# Patient Record
Sex: Female | Born: 1945 | Race: White | Hispanic: No | Marital: Married | Smoking: Never smoker
Health system: Southern US, Community
[De-identification: ages and names within clinical notes are randomized; demographics above are authoritative.]

## PROBLEM LIST (undated history)

## (undated) DIAGNOSIS — K3184 Gastroparesis: Secondary | ICD-10-CM

## (undated) DIAGNOSIS — D649 Anemia, unspecified: Secondary | ICD-10-CM

## (undated) DIAGNOSIS — E785 Hyperlipidemia, unspecified: Secondary | ICD-10-CM

## (undated) DIAGNOSIS — I10 Essential (primary) hypertension: Secondary | ICD-10-CM

## (undated) DIAGNOSIS — M792 Neuralgia and neuritis, unspecified: Secondary | ICD-10-CM

## (undated) DIAGNOSIS — E119 Type 2 diabetes mellitus without complications: Secondary | ICD-10-CM

## (undated) DIAGNOSIS — E079 Disorder of thyroid, unspecified: Secondary | ICD-10-CM

## (undated) DIAGNOSIS — N189 Chronic kidney disease, unspecified: Secondary | ICD-10-CM

## (undated) DIAGNOSIS — F319 Bipolar disorder, unspecified: Secondary | ICD-10-CM

## (undated) HISTORY — DX: Disorder of thyroid, unspecified: E07.9

## (undated) HISTORY — DX: Anemia, unspecified: D64.9

## (undated) HISTORY — DX: Hyperlipidemia, unspecified: E78.5

## (undated) HISTORY — DX: Type 2 diabetes mellitus without complications: E11.9

## (undated) HISTORY — DX: Bipolar disorder, unspecified: F31.9

## (undated) HISTORY — PX: CERVICAL DISC SURGERY: SHX588

## (undated) HISTORY — DX: Neuralgia and neuritis, unspecified: M79.2

## (undated) HISTORY — PX: CHOLECYSTECTOMY: SHX55

## (undated) HISTORY — DX: Gastroparesis: K31.84

## (undated) HISTORY — DX: Chronic kidney disease, unspecified: N18.9

## (undated) HISTORY — DX: Essential (primary) hypertension: I10

---

## 2003-11-02 ENCOUNTER — Ambulatory Visit: Payer: Self-pay | Admitting: *Deleted

## 2004-01-15 ENCOUNTER — Ambulatory Visit: Payer: Self-pay | Admitting: Psychiatry

## 2004-04-02 ENCOUNTER — Encounter (HOSPITAL_COMMUNITY): Admission: RE | Admit: 2004-04-02 | Discharge: 2004-07-01 | Payer: Self-pay | Admitting: Endocrinology

## 2004-04-17 ENCOUNTER — Ambulatory Visit: Payer: Self-pay | Admitting: Psychiatry

## 2004-07-15 ENCOUNTER — Ambulatory Visit: Payer: Self-pay | Admitting: Psychiatry

## 2004-08-28 ENCOUNTER — Ambulatory Visit (HOSPITAL_COMMUNITY): Admission: RE | Admit: 2004-08-28 | Discharge: 2004-08-28 | Payer: Self-pay | Admitting: Internal Medicine

## 2004-09-09 ENCOUNTER — Ambulatory Visit: Payer: Self-pay | Admitting: Psychiatry

## 2004-12-03 ENCOUNTER — Emergency Department (HOSPITAL_COMMUNITY): Admission: EM | Admit: 2004-12-03 | Discharge: 2004-12-03 | Payer: Self-pay | Admitting: Emergency Medicine

## 2004-12-04 ENCOUNTER — Ambulatory Visit: Payer: Self-pay | Admitting: Psychiatry

## 2005-03-05 ENCOUNTER — Ambulatory Visit: Payer: Self-pay | Admitting: Psychiatry

## 2005-04-02 ENCOUNTER — Ambulatory Visit: Payer: Self-pay | Admitting: Cardiology

## 2005-06-02 ENCOUNTER — Ambulatory Visit (HOSPITAL_COMMUNITY): Payer: Self-pay | Admitting: Psychiatry

## 2005-08-04 ENCOUNTER — Ambulatory Visit (HOSPITAL_COMMUNITY): Payer: Self-pay | Admitting: Psychiatry

## 2005-11-03 ENCOUNTER — Ambulatory Visit (HOSPITAL_COMMUNITY): Payer: Self-pay | Admitting: Psychiatry

## 2006-02-04 ENCOUNTER — Ambulatory Visit (HOSPITAL_COMMUNITY): Payer: Self-pay | Admitting: Psychiatry

## 2006-05-06 ENCOUNTER — Ambulatory Visit (HOSPITAL_COMMUNITY): Payer: Self-pay | Admitting: Psychiatry

## 2006-07-11 IMAGING — CR DG KNEE COMPLETE 4+V*L*
5 series · 5 of 5 positions shown · non-contrast
Comparison: None.

CLINICAL DATA: Chronic left knee pain, no known injury.

LEFT KNEE - 4 VIEW  08/28/2004:

[view not recorded (1 of 5)]
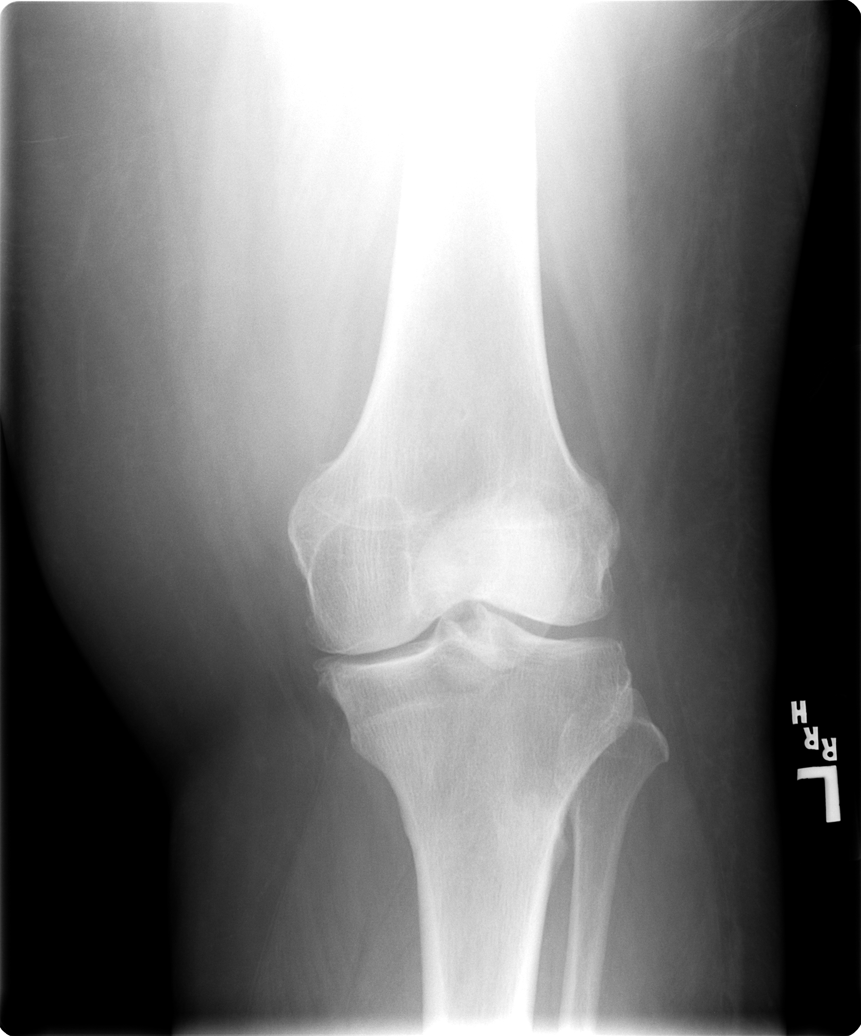

[view not recorded (2 of 5)]
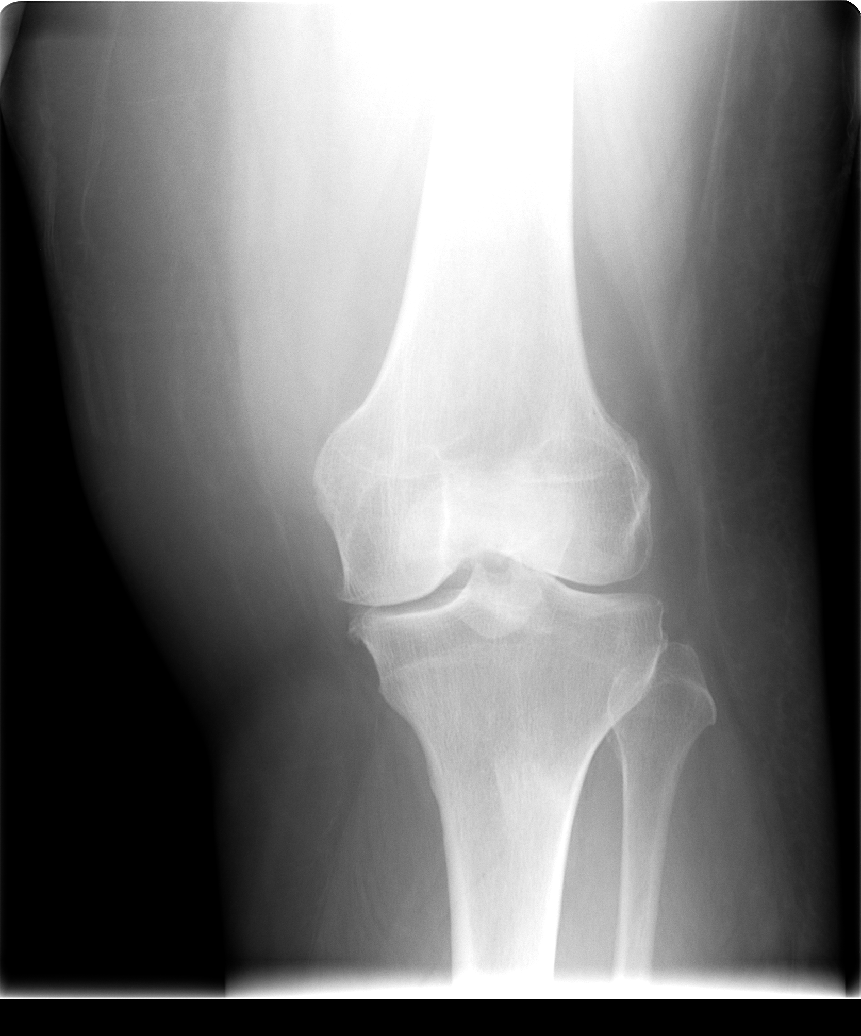

[view not recorded (3 of 5)]
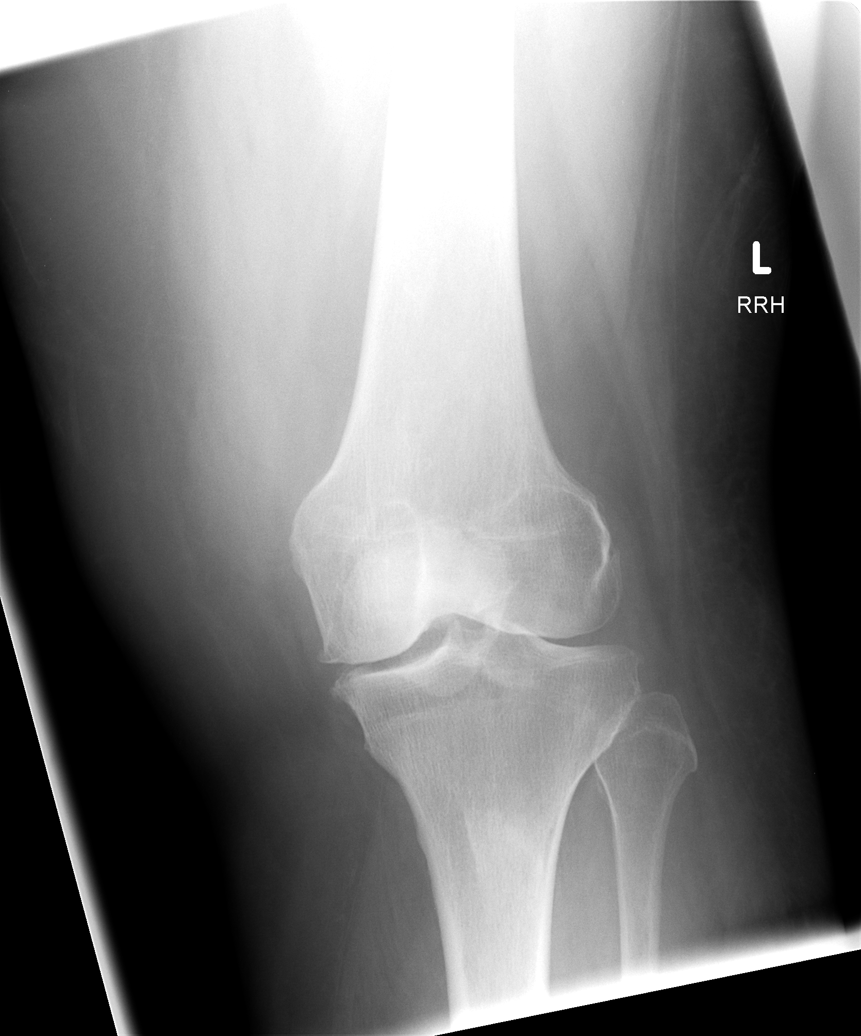

[view not recorded (4 of 5)]
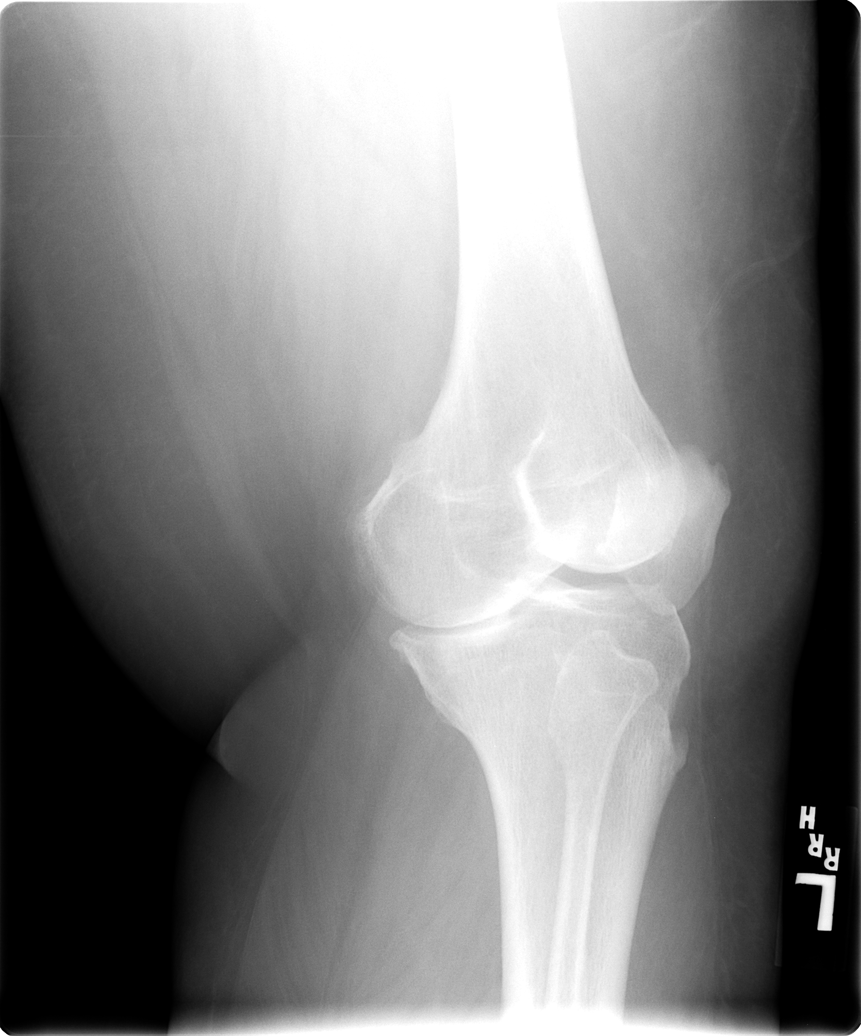

[view not recorded (5 of 5)]
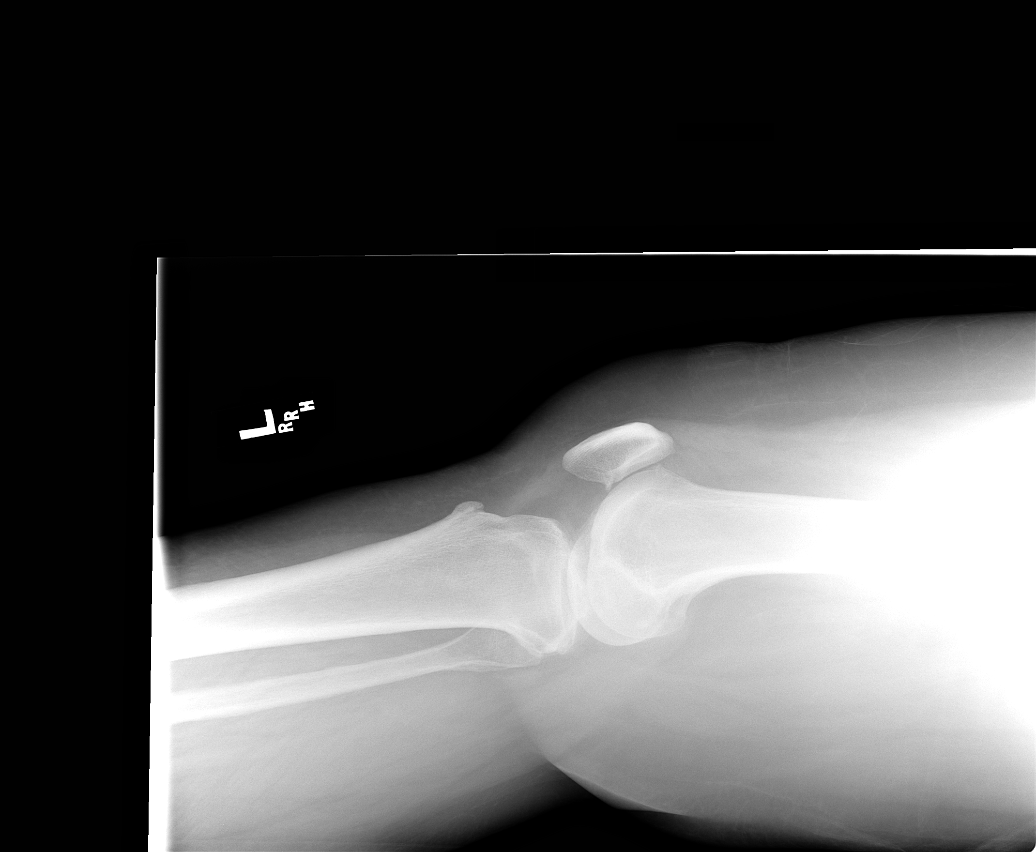

[5 of 5 positions shown; findings below may reference images not displayed]

FINDINGS: There is no evidence of acute or subacute fracture or dislocation.
Marked medial joint space narrowing is present and there is moderate
patellofemoral and medial joint space narrowing. Spurring is present involving
all 3 compartments. Prominent tibial tuberosity is noted. There is no evidence
of a significant joint effusion. The patella is somewhat more inferiorly
positioned than usual.
IMPRESSION: 1. No acute or subacute skeletal abnormality.
2. Moderate osteoarthritis, worst in the medial compartment.
3. Patella baja.

## 2006-08-03 ENCOUNTER — Ambulatory Visit (HOSPITAL_COMMUNITY): Payer: Self-pay | Admitting: Psychiatry

## 2006-11-04 ENCOUNTER — Ambulatory Visit (HOSPITAL_COMMUNITY): Payer: Self-pay | Admitting: Psychiatry

## 2007-02-08 ENCOUNTER — Ambulatory Visit (HOSPITAL_COMMUNITY): Payer: Self-pay | Admitting: Psychiatry

## 2007-03-15 ENCOUNTER — Ambulatory Visit (HOSPITAL_COMMUNITY): Payer: Self-pay | Admitting: Psychiatry

## 2007-05-05 ENCOUNTER — Ambulatory Visit (HOSPITAL_COMMUNITY): Payer: Self-pay | Admitting: Psychiatry

## 2007-08-04 ENCOUNTER — Ambulatory Visit (HOSPITAL_COMMUNITY): Payer: Self-pay | Admitting: Psychiatry

## 2007-12-22 ENCOUNTER — Ambulatory Visit (HOSPITAL_COMMUNITY): Payer: Self-pay | Admitting: Psychiatry

## 2008-04-19 ENCOUNTER — Ambulatory Visit (HOSPITAL_COMMUNITY): Payer: Self-pay | Admitting: Psychiatry

## 2008-08-16 ENCOUNTER — Ambulatory Visit (HOSPITAL_COMMUNITY): Payer: Self-pay | Admitting: Psychiatry

## 2008-12-13 ENCOUNTER — Ambulatory Visit (HOSPITAL_COMMUNITY): Payer: Self-pay | Admitting: Psychiatry

## 2009-04-30 ENCOUNTER — Ambulatory Visit (HOSPITAL_COMMUNITY): Payer: Self-pay | Admitting: Psychiatry

## 2009-07-30 ENCOUNTER — Ambulatory Visit (HOSPITAL_COMMUNITY): Payer: Self-pay | Admitting: Psychiatry

## 2010-01-21 ENCOUNTER — Ambulatory Visit (HOSPITAL_COMMUNITY): Payer: Self-pay | Admitting: Psychiatry

## 2010-04-10 DIAGNOSIS — R079 Chest pain, unspecified: Secondary | ICD-10-CM

## 2010-04-11 DIAGNOSIS — R072 Precordial pain: Secondary | ICD-10-CM

## 2010-04-11 DIAGNOSIS — R0602 Shortness of breath: Secondary | ICD-10-CM

## 2010-05-20 ENCOUNTER — Encounter (INDEPENDENT_AMBULATORY_CARE_PROVIDER_SITE_OTHER): Payer: Medicare Other | Admitting: Psychiatry

## 2010-05-20 DIAGNOSIS — F333 Major depressive disorder, recurrent, severe with psychotic symptoms: Secondary | ICD-10-CM

## 2010-08-19 ENCOUNTER — Encounter (HOSPITAL_COMMUNITY): Payer: Medicare Other | Admitting: Psychiatry

## 2010-08-28 ENCOUNTER — Encounter (INDEPENDENT_AMBULATORY_CARE_PROVIDER_SITE_OTHER): Payer: Medicare Other | Admitting: Psychiatry

## 2010-08-28 ENCOUNTER — Encounter (HOSPITAL_COMMUNITY): Payer: Medicare Other | Admitting: Psychiatry

## 2010-08-28 DIAGNOSIS — F333 Major depressive disorder, recurrent, severe with psychotic symptoms: Secondary | ICD-10-CM

## 2010-10-23 ENCOUNTER — Encounter (INDEPENDENT_AMBULATORY_CARE_PROVIDER_SITE_OTHER): Payer: Medicare Other | Admitting: Psychiatry

## 2010-10-23 DIAGNOSIS — F333 Major depressive disorder, recurrent, severe with psychotic symptoms: Secondary | ICD-10-CM

## 2011-01-03 ENCOUNTER — Other Ambulatory Visit (HOSPITAL_COMMUNITY): Payer: Self-pay | Admitting: Psychiatry

## 2011-01-15 ENCOUNTER — Encounter (HOSPITAL_COMMUNITY): Payer: Medicare Other | Admitting: Psychiatry

## 2011-01-15 ENCOUNTER — Ambulatory Visit (INDEPENDENT_AMBULATORY_CARE_PROVIDER_SITE_OTHER): Payer: Medicare Other | Admitting: Psychiatry

## 2011-01-15 DIAGNOSIS — F3189 Other bipolar disorder: Secondary | ICD-10-CM

## 2011-01-15 MED ORDER — LITHIUM CARBONATE 300 MG PO TABS: ORAL_TABLET | ORAL | Status: DC

## 2011-01-15 MED ORDER — DOXEPIN HCL 50 MG PO CAPS: ORAL_CAPSULE | ORAL | Status: DC

## 2011-01-15 NOTE — Progress Notes (Signed)
Cassandra Vanwinkle [id number]  Subjective Patient came with her husband. sHe is doing better on her current medication. sHe is sleeping good.s He denies any side effects of medication. She is compliant with her medication. She had a good Thanksgiving. Her son got married in September and she is hoping to have a good Christmas. She denies any agitation anger or mood swings. She described her mood has been stable with the medication. She still takes lithium small dose 300 mg half twice a day. Recently she has seen primary care physician and her lithium level is 0.9. She also takes Depakote.  Mental Status Examination Patient is casually dressed and fairly groomed. She uses wheelchair. Her speech is soft clear. There is a poverty of thought content but she denies any active or passive suicidal thoughts or homicidal thoughts. Her thought process is slow but logical linear and goal-directed. She denies any auditory or visual hallucination. Her attention and concentration is fair. She's alert and oriented x3. Her insight judgment and impulse control is okay  Current Medication Current outpatient prescriptions:divalproex (DEPAKOTE) 250 MG DR tablet, , Disp: , Rfl: ;  doxepin (SINEQUAN) 50 MG capsule, Take 5 capsule at bed time, Disp: 150 capsule, Rfl: 2;  SYNTHROID 112 MCG tablet, , Disp: , Rfl: ;  Vitamin D, Ergocalciferol, (DRISDOL) 50000 UNITS CAPS, , Disp: , Rfl: ;  VYTORIN 10-40 MG per tablet, , Disp: , Rfl: ;  lithium 300 MG tablet, Take 1/2 twice a day, Disp: 90 tablet, Rfl: 2   Assessment Bipolar disorder  Plan I will continue her current medication which is doxepin 50 mg and she takes 5 capsule at bedtime and lithium 300 mg half twice a day. I have explained risks and benefits of medication in detail. I recommended to call us if she has any question or concern about the medication or if she feels worsening of her symptoms. I will see her again in 3 months

## 2011-04-16 ENCOUNTER — Encounter (HOSPITAL_COMMUNITY): Payer: Self-pay | Admitting: Psychiatry

## 2011-04-16 ENCOUNTER — Ambulatory Visit (INDEPENDENT_AMBULATORY_CARE_PROVIDER_SITE_OTHER): Payer: Medicare Other | Admitting: Psychiatry

## 2011-04-16 DIAGNOSIS — F319 Bipolar disorder, unspecified: Secondary | ICD-10-CM

## 2011-04-16 DIAGNOSIS — F3189 Other bipolar disorder: Secondary | ICD-10-CM

## 2011-04-16 MED ORDER — DOXEPIN HCL 50 MG PO CAPS: ORAL_CAPSULE | ORAL | Status: DC

## 2011-04-16 MED ORDER — LITHIUM CARBONATE 300 MG PO TABS: ORAL_TABLET | ORAL | Status: DC

## 2011-04-16 NOTE — Progress Notes (Signed)
Cassandra Smitherman [id number]  Chief complaint Patient came today for her followup appointment with her husband. She has been compliant with her medication and reported no side effects. She has recently seen her endocrinologist for management of diabetes. She reported her blood work is stable. Husband also reported she is doing better and taking her medication regularly. Patient denies any agitation anger or mood swings. Patient denies any insomnia or crying spells. She likes her current psychiatric medication. She denies any concern or issues with her medication. She still takes doxepin and lithium for her bipolar disorder.  Current psychiatric medication Doxepin 50 mg 5 at bedtime Lithium 300 mg half twice a day  Depakote 250 mg twice a day prescribed by her primary care physician.  Medical history Patient has history of hyperlipidemia, hypertension, chronic renal insufficiency, gastroparesis, vitamin D deficiency and neuropathic pain. She uses wheelchair for ambulation.  Mental status examination Patient is casually dressed and fairly groomed. She uses wheelchair. Her speech is soft clear. She described her mood is neutral and her affect is mood congruent. There is a poverty of thought content but she denies any active or passive suicidal thoughts or homicidal thoughts. Her thought process is slow but logical linear and goal-directed. She denies any auditory or visual hallucination. Her attention and concentration is fair. She's alert and oriented x3. Her insight judgment and impulse control is okay  Assessment Axis I Bipolar disorder Axis II deferred Axis III see medical history Axis IV mild to moderate Axis V 50-55  Plan I will continue her current medication which is doxepin 50 mg and she takes 5 capsule at bedtime and lithium 300 mg half twice a day. Patient and her husband is away about the insufficiency and impact of lithium on her kidney however they are very reluctant to cut  down lithium does. I have explained risks and benefits of medication in detail. I recommended to call us if she has any question or concern about the medication or if she feels worsening of her symptoms. I will see her again in 3 months

## 2011-07-16 ENCOUNTER — Encounter (HOSPITAL_COMMUNITY): Payer: Self-pay | Admitting: Psychiatry

## 2011-07-16 ENCOUNTER — Ambulatory Visit (INDEPENDENT_AMBULATORY_CARE_PROVIDER_SITE_OTHER): Payer: Medicare Other | Admitting: Psychiatry

## 2011-07-16 DIAGNOSIS — F3189 Other bipolar disorder: Secondary | ICD-10-CM

## 2011-07-16 MED ORDER — LITHIUM CARBONATE 300 MG PO TABS: ORAL_TABLET | ORAL | Status: DC

## 2011-07-16 MED ORDER — DOXEPIN HCL 50 MG PO CAPS: ORAL_CAPSULE | ORAL | Status: DC

## 2011-07-16 NOTE — Progress Notes (Signed)
Chief complaint Medication management and followup.  History of presenting illness. Patient came today for her followup appointment with her husband. She has been compliant with her medication and reported no side effects. She has recently seen her endocrinologist for management of diabetes. She reported her blood work is stable.  She was told her hemoglobin A1c is 5.4.  Patient is very happy about her blood sugar.  She sleeping better.  She denies any agitation anger and mood swing.  She has some tremors however they are chronic.  Her husband endorse that patient is doing better on her current medication.  She does not want to change or reduce the dosage.  She denies any crying spell or any social isolation.  She has no issues with the medication.  Current psychiatric medication Doxepin 50 mg 5 at bedtime Lithium 300 mg half twice a day  Depakote 250 mg twice a day prescribed by her primary care physician.  Medical history Patient has history of hyperlipidemia, hypertension, chronic renal insufficiency, gastroparesis, vitamin D deficiency and neuropathic pain. She uses wheelchair for ambulation.  Her primary care physician is Dr. Clelia Croft in [place].  Mental status examination Patient is casually dressed and fairly groomed.  She is obese and uses wheelchair.  She has multiple scratch marks and bruises and her arm which are chronic.  She has 30 of thought content however her speech is slow and coherent.  Her thought process is also slow but logical linear goal-directed.  Her attention and concentration is fair.  She denies any active or passive suicidal thinking and homicidal thinking.  She described her mood is neutral and her affect is mood congruent.  She denies any auditory or visual hallucination.  There no psychotic symptoms present at this time.  Her attention and concentration is fair. She's alert and oriented x3. Her insight judgment and impulse control is okay  Assessment Axis I Bipolar  disorder Axis II deferred Axis III see medical history Axis IV mild to moderate Axis V 50-55  Plan I will continue her current medication which is doxepin 50 mg and she takes 5 capsule at bedtime and lithium 300 mg half twice a day. Patient and her husband is aware about chronic renal insufficiency and impact of lithium on her kidney however they are very reluctant to cut down lithium does. I have explained risks and benefits of medication in detail. I recommended to call us if she has any question or concern about the medication or if she feels worsening of her symptoms. I will see her again in 3 months

## 2011-10-15 ENCOUNTER — Ambulatory Visit (HOSPITAL_COMMUNITY): Payer: Self-pay | Admitting: Psychiatry

## 2011-10-20 ENCOUNTER — Ambulatory Visit (INDEPENDENT_AMBULATORY_CARE_PROVIDER_SITE_OTHER): Payer: Medicare Other | Admitting: Psychiatry

## 2011-10-20 ENCOUNTER — Encounter (HOSPITAL_COMMUNITY): Payer: Self-pay | Admitting: Psychiatry

## 2011-10-20 DIAGNOSIS — F319 Bipolar disorder, unspecified: Secondary | ICD-10-CM

## 2011-10-20 DIAGNOSIS — F3189 Other bipolar disorder: Secondary | ICD-10-CM

## 2011-10-20 MED ORDER — LITHIUM CARBONATE 300 MG PO TABS: ORAL_TABLET | ORAL | Status: DC

## 2011-10-20 MED ORDER — DOXEPIN HCL 50 MG PO CAPS: ORAL_CAPSULE | ORAL | Status: DC

## 2011-10-20 NOTE — Progress Notes (Signed)
Chief complaint Medication management and followup.  History of presenting illness. Patient came today for her followup appointment with her husband. She has been compliant with her medication and reported no side effects. She denies any insomnia or any mood swing.  She denies any recent fall .  She scheduled to see her endocrinologist in [place] however husband is not happy due to transportation.  Transportation company charged every time he goes to [place] and he is wondering if he can find endocrinologist in this area.  Patient is scheduled to have blood work in this week.  Overall patient is doing well on her current psychiatric medication.  Patient denies any crying spells racing thoughts or any feeling of hopelessness or helplessness.  She likes her current medication and wants to take as prescribed.  She's not drinking or using any illegal substance.  Current psychiatric medication Doxepin 50 mg 5 at bedtime Lithium 300 mg half twice a day  Depakote 250 mg twice a day prescribed by her primary care physician.  Medical history Patient has history of hyperlipidemia, hypertension, chronic renal insufficiency, gastroparesis, vitamin D deficiency and neuropathic pain. She uses wheelchair for ambulation.  Her primary care physician is Dr. Sherryll Burger in [place].  Mental status examination Patient is casually dressed and fairly groomed.  She is obese and uses wheelchair.  She has multiple scratch marks and bruises and her arm which are chronic.  Her speech is slow but clear and coherent.  She has poverty of of thought content however there is no paranoia or delusion or psychotic symptoms.  Her attention and concentration is fair.  She denies any active or passive suicidal thinking and homicidal thinking.  She described her mood is neutral and her affect is mood congruent.  She denies any auditory or visual hallucination.  There no psychotic symptoms present at this time.  Her attention and concentration  is fair. She's alert and oriented x3. Her insight judgment and impulse control is okay  Assessment Axis I Bipolar disorder Axis II deferred Axis III see medical history Axis IV mild to moderate Axis V 50-55  Plan I will continue her current medication which is doxepin 50 mg and she takes 5 capsule at bedtime and lithium 300 mg half twice a day.  Patient has chronic renal insufficiency, patient and her husband is aware about the impact of lithium on her kidney however they are very reluctant to cut down lithium does. I have explained risks and benefits of medication in detail. I recommended to call us if she has any question or concern about the medication or if she feels worsening of her symptoms. I will see her again in 3 months

## 2011-12-28 ENCOUNTER — Telehealth (HOSPITAL_COMMUNITY): Payer: Self-pay | Admitting: *Deleted

## 2011-12-28 ENCOUNTER — Other Ambulatory Visit (HOSPITAL_COMMUNITY): Payer: Self-pay | Admitting: Psychiatry

## 2012-01-12 ENCOUNTER — Encounter (HOSPITAL_COMMUNITY): Payer: Self-pay | Admitting: Psychiatry

## 2012-01-12 ENCOUNTER — Ambulatory Visit (INDEPENDENT_AMBULATORY_CARE_PROVIDER_SITE_OTHER): Payer: Medicare Other | Admitting: Psychiatry

## 2012-01-12 DIAGNOSIS — F319 Bipolar disorder, unspecified: Secondary | ICD-10-CM

## 2012-01-12 DIAGNOSIS — F3189 Other bipolar disorder: Secondary | ICD-10-CM

## 2012-01-12 MED ORDER — LITHIUM CARBONATE 300 MG PO TABS: ORAL_TABLET | ORAL | Status: DC

## 2012-01-12 MED ORDER — DOXEPIN HCL 50 MG PO CAPS: ORAL_CAPSULE | ORAL | Status: DC

## 2012-01-12 NOTE — Progress Notes (Signed)
Chief complaint Chief Complaint  Patient presents with  . Follow-up  . Establish Care  . Medication Refill   Subjective: "I'm doing good.  It is Christmas time and I'm ready for Christmas."  History of presenting illness. Patient came today for her followup appointment with her husband. Pt reports compliance with psychotropic medications with adequate benefit and no side effects.  Her recent lithium level was on the low end, but clinically she is fine. Reportedly she will be getting another one soon.  Will look for those results. Her pain management is also going well.  Her falls have increased recently and she has to use a Kashari Chalmers indoors.  She has noted a little bit of weight increase.  She does a movement video about once a day.  Current psychiatric medication Doxepin 50 mg 5 at bedtime Lithium 300 mg half twice a day  Depakote 250 mg twice a day prescribed by her primary care physician.  Medical history Patient has history of hyperlipidemia, hypertension, chronic renal insufficiency, gastroparesis, vitamin D deficiency and neuropathic pain. She uses wheelchair for ambulation.  Her primary care physician is Dr. Sherryll Burger in [place].  Mental status examination Patient is casually dressed and fairly groomed.  She is obese and uses wheelchair.  She has multiple scratch marks and bruises and her arm which are chronic.  Her speech is slow but clear and coherent.  She has poverty of of thought content however there is no paranoia or delusion or psychotic symptoms.  Her attention and concentration is fair.  She denies any active or passive suicidal thinking and homicidal thinking.  She described her mood is neutral and her affect is mood congruent.  She denies any auditory or visual hallucination.  There no psychotic symptoms present at this time.  Her attention and concentration is fair. She's alert and oriented x3. Her insight judgment and impulse control is okay  Assessment Axis I Bipolar  disorder Axis II deferred Axis III see medical history Axis IV mild to moderate Axis V 50-55  Plan I took her vitals.  I reviewed CC, tobacco/med/surg Hx, meds effects/ side effects, problem list, therapies and responses as well as current situation/symptoms discussed options. See orders and pt instructions for more details.

## 2012-01-12 NOTE — Patient Instructions (Signed)
Have a happy holiday.  

## 2012-02-04 ENCOUNTER — Telehealth (HOSPITAL_COMMUNITY): Payer: Self-pay | Admitting: Psychiatry

## 2012-02-04 DIAGNOSIS — F3189 Other bipolar disorder: Secondary | ICD-10-CM

## 2012-02-05 MED ORDER — LITHIUM CARBONATE 300 MG PO TABS: ORAL_TABLET | ORAL | Status: DC

## 2012-02-05 NOTE — Telephone Encounter (Signed)
[facility] selected as pharmacy and Lithium script sent there.

## 2012-04-11 ENCOUNTER — Encounter (HOSPITAL_COMMUNITY): Payer: Self-pay | Admitting: Psychiatry

## 2012-04-11 ENCOUNTER — Ambulatory Visit (INDEPENDENT_AMBULATORY_CARE_PROVIDER_SITE_OTHER): Payer: 59 | Admitting: Psychiatry

## 2012-04-11 DIAGNOSIS — F3189 Other bipolar disorder: Secondary | ICD-10-CM

## 2012-04-11 DIAGNOSIS — F319 Bipolar disorder, unspecified: Secondary | ICD-10-CM

## 2012-04-11 DIAGNOSIS — F5105 Insomnia due to other mental disorder: Secondary | ICD-10-CM

## 2012-04-11 MED ORDER — LITHIUM CARBONATE 300 MG PO TABS: ORAL_TABLET | ORAL | Status: DC

## 2012-04-11 MED ORDER — DOXEPIN HCL 50 MG PO CAPS: ORAL_CAPSULE | ORAL | Status: DC

## 2012-04-11 NOTE — Progress Notes (Signed)
[facility] [id number] Progress Note DEA BITTING MRN: [id number] DOB: 03-Oct-1945 Age: 67 y.o.  Date: 04/11/2012 Start Time: 10:15 AM End Time: 10:30 AM  Chief Complaint: Chief Complaint  Patient presents with  . Depression  . Follow-up  . Medication Refill   Subjective: "I'm doing good.  It is Christmas time and I'm ready for Christmas." Depression 0/10 and Anxiety 0/10, where 1 is the best and 10 is the worst.  Pain is 4/10 in her leg  History of presenting illness. Patient came today for her followup appointment with her husband.  Pt reports that she is compliant with the psychotropic medications with good benefit and no noticeable side effects.  She seems tio be pleased with how she is doing now.   Current psychiatric medication Doxepin 50 mg 5 at bedtime Lithium 300 mg half twice a day  Depakote 250 mg twice a day prescribed by her primary care physician.  Family History family history is negative for Bipolar disorder, and Dementia, and OCD, and ADD / ADHD, and Alcohol abuse, and Drug abuse, and Anxiety disorder, and Depression, and Paranoid behavior, and Schizophrenia, and Seizures, and Sexual abuse, and Physical abuse, .  Medical history Patient has history of hyperlipidemia, hypertension, chronic renal insufficiency, gastroparesis, vitamin D deficiency and neuropathic pain. She uses wheelchair for ambulation.  Her primary care physician is Dr. Sherryll Burger in [place].  Mental status examination Patient is casually dressed and fairly groomed.  She is obese and uses wheelchair.  She has multiple scratch marks and bruises and her arm which are chronic.  Her speech is slow but clear and coherent.  She has poverty of of thought content however there is no paranoia or delusion or psychotic symptoms.  Her attention and concentration is fair.  She denies any active or passive suicidal thinking and homicidal thinking.  She described her mood is neutral and her affect is mood congruent.   She denies any auditory or visual hallucination.  There no psychotic symptoms present at this time.  Her attention and concentration is fair. She's alert and oriented x3. Her insight judgment and impulse control is okay  Lab Results: No results found for this or any previous visit (from the past 8736 hour(s)). PCP draws her labs with no concerns noted.  Will have the PCP draw the Lithium level.  Assessment Axis I Bipolar disorder Axis II deferred Axis III see medical history Axis IV mild to moderate Axis V 50-55  Plan/Discussion: I took her vitals.  I reviewed CC, tobacco/med/surg Hx, meds effects/ side effects, problem list, therapies and responses as well as current situation/symptoms discussed options. Continue current effective medications. See orders and pt instructions for more details.  Medical Decision Making Problem Points:  Established problem, stable/improving (1), Review of last therapy session (1) and Review of psycho-social stressors (1) Data Points:  Review or order clinical lab tests (1) Review of medication regiment & side effects (2)  I certify that outpatient services furnished can reasonably be expected to improve the patient's condition.   Orson Aloe, MD, [facility]

## 2012-04-11 NOTE — Patient Instructions (Signed)
Call if problems or concerns.  Look forward to hearing about the lithium and Depakote levels at the next visit.

## 2012-07-12 ENCOUNTER — Ambulatory Visit (HOSPITAL_COMMUNITY): Payer: Self-pay | Admitting: Psychiatry

## 2012-07-22 ENCOUNTER — Encounter (HOSPITAL_COMMUNITY): Payer: Self-pay | Admitting: Psychiatry

## 2012-07-22 ENCOUNTER — Ambulatory Visit (INDEPENDENT_AMBULATORY_CARE_PROVIDER_SITE_OTHER): Payer: 59 | Admitting: Psychiatry

## 2012-07-22 VITALS — BP 130/73 | HR 81

## 2012-07-22 DIAGNOSIS — F3189 Other bipolar disorder: Secondary | ICD-10-CM

## 2012-07-22 DIAGNOSIS — F5105 Insomnia due to other mental disorder: Secondary | ICD-10-CM

## 2012-07-22 DIAGNOSIS — F319 Bipolar disorder, unspecified: Secondary | ICD-10-CM

## 2012-07-22 MED ORDER — LITHIUM CARBONATE 300 MG PO TABS: ORAL_TABLET | ORAL | Status: DC

## 2012-07-22 MED ORDER — DOXEPIN HCL 50 MG PO CAPS: ORAL_CAPSULE | ORAL | Status: DC

## 2012-07-22 NOTE — Progress Notes (Signed)
[facility] [address] Progress Note KETTY BITTON MRN: [id number] DOB: September 19, 1945 Age: 67 y.o.  Date: 07/22/2012 Start Time: 10:55 AM End Time: 11:05 AM  Chief Complaint: Chief Complaint  Patient presents with  . Depression  . Follow-up  . Medication Refill   Subjective: "I'm doing good." Depression 0/10 and Anxiety 0/10, where 1 is the best and 10 is the worst.  Pain is 4/10 in her leg  History of presenting illness. Patient came today for her followup appointment with her husband.  Pt reports that she is compliant with the psychotropic medications with good benefit and no noticeable side effects.  She seems tio be pleased with how she is doing now.   Current psychiatric medication Doxepin 50 mg 5 at bedtime Lithium 300 mg half twice a day  Depakote 250 mg twice a day prescribed by her primary care physician.  Vitals: BP 130/73  Pulse 81  Allergies: Allergies  Allergen Reactions  . Tetanus Toxoids Anaphylaxis  . Codeine Nausea And Vomiting   Medical History: Past Medical History  Diagnosis Date  . Diabetes mellitus type II   . Hyperlipemia   . HTN (hypertension)   . CRI (chronic renal insufficiency)   . Neuropathic pain   . Thyroid disease   . Vitamin D deficiency   . Gastroparesis   . Anemia   . Bipolar 1 disorder   Patient has history of hyperlipidemia, hypertension, chronic renal insufficiency, gastroparesis, vitamin D deficiency and neuropathic pain. She uses wheelchair for ambulation.  Her primary care physician is Dr. Sherryll Burger in [place].  Surgical History: Past Surgical History  Procedure Laterality Date  . Cholecystectomy    . Cervical disc surgery     Family History: family history is negative for Bipolar disorder, and Dementia, and OCD, and ADD / ADHD, and Alcohol abuse, and Drug abuse, and Anxiety disorder, and Depression, and Paranoid behavior, and Schizophrenia, and Seizures, and Sexual abuse, and Physical abuse, . Reviewed and nothing new  today.   Mental status examination Patient is casually dressed and fairly groomed.  She is obese and uses wheelchair.  She has multiple scratch marks and bruises and her arm which are chronic.  Her speech is slow but clear and coherent.  She has poverty of of thought content however there is no paranoia or delusion or psychotic symptoms.  Her attention and concentration is fair.  She denies any active or passive suicidal thinking and homicidal thinking.  She described her mood is neutral and her affect is mood congruent.  She denies any auditory or visual hallucination.  There no psychotic symptoms present at this time.  Her attention and concentration is fair. She's alert and oriented x3. Her insight judgment and impulse control is okay  Lab Results: No results found for this or any previous visit (from the past 8736 hour(s)). PCP draws her labs with no concerns noted.  Will have the PCP draw the Lithium level.  Assessment Axis I Bipolar disorder Axis II deferred Axis III see medical history Axis IV mild to moderate Axis V 50-55  Plan/Discussion: I took her vitals.  I reviewed CC, tobacco/med/surg Hx, meds effects/ side effects, problem list, therapies and responses as well as current situation/symptoms discussed options. Continue current effective medications. See orders and pt instructions for more details.  Medical Decision Making Problem Points:  Established problem, stable/improving (1), Review of last therapy session (1) and Review of psycho-social stressors (1) Data Points:  Review or order clinical lab tests (1)  Review of medication regiment & side effects (2)  I certify that outpatient services furnished can reasonably be expected to improve the patient's condition.   Orson Aloe, MD, [facility]

## 2012-07-22 NOTE — Patient Instructions (Signed)
Keep it up.  Call if problems or concerns.  

## 2012-10-21 ENCOUNTER — Encounter (HOSPITAL_COMMUNITY): Payer: Self-pay | Admitting: Psychiatry

## 2012-10-21 ENCOUNTER — Ambulatory Visit (HOSPITAL_COMMUNITY): Payer: Self-pay | Admitting: Psychiatry

## 2012-10-21 ENCOUNTER — Ambulatory Visit (INDEPENDENT_AMBULATORY_CARE_PROVIDER_SITE_OTHER): Payer: 59 | Admitting: Psychiatry

## 2012-10-21 VITALS — BP 120/62 | Ht 60.0 in | Wt 210.0 lb

## 2012-10-21 DIAGNOSIS — F319 Bipolar disorder, unspecified: Secondary | ICD-10-CM

## 2012-10-21 DIAGNOSIS — F3189 Other bipolar disorder: Secondary | ICD-10-CM

## 2012-10-21 MED ORDER — DOXEPIN HCL 50 MG PO CAPS: ORAL_CAPSULE | ORAL | Status: DC

## 2012-10-21 MED ORDER — LITHIUM CARBONATE 300 MG PO TABS: ORAL_TABLET | ORAL | Status: DC

## 2012-10-21 NOTE — Progress Notes (Signed)
Patient ID: Cassandra Klein, female   DOB: Jul 13, 1945, 67 y.o.   MRN: [id number] [facility] [id number] Progress Note Cassandra Klein MRN: [id number] DOB: 03-16-1945 Age: 67 y.o.  Date: 10/21/2012 Start Time: 10:55 AM End Time: 11:05 AM  Chief Complaint: Chief Complaint  Patient presents with  . Anxiety  . Depression  . Follow-up   Subjective: "I'm doing good." This patient is a 67 year old married white female who lives with her husband in [place]. She is a retired Chart[employer] loss adjuster.  Apparently the patient began having mood problems in her 30s. She had significant mood swings. She's been on medication ever since and feels quite stable on the doses that she takes now. She's sleeping well and denies being depressed or manic. Her husband is in agreement. Both of them seem very frustrated with having to come here and answer questions somewhat was difficult to get much of a history. The patient is unable to walk due to severe neuropathy and is wheelchair-bound. Her husband is her primary caretaker. She spends most of her time watching TV and reading. She claims that she's content with her current dosages  Current psychiatric medication Doxepin 50 mg 5 at bedtime Lithium 300 mg half twice a day  Depakote 250 mg twice a day prescribed by her primary care physician.  Vitals: BP 120/62  Allergies: Allergies  Allergen Reactions  . Tetanus Toxoids Anaphylaxis  . Codeine Nausea And Vomiting   Medical History: Past Medical History  Diagnosis Date  . Diabetes mellitus type II   . Hyperlipemia   . HTN (hypertension)   . CRI (chronic renal insufficiency)   . Neuropathic pain   . Thyroid disease   . Vitamin D deficiency   . Gastroparesis   . Anemia   . Bipolar 1 disorder   Patient has history of hyperlipidemia, hypertension, chronic renal insufficiency, gastroparesis, vitamin D deficiency and neuropathic pain. She uses wheelchair for ambulation.  Her primary care physician is Dr. Sherryll Burger  in [place].  Surgical History: Past Surgical History  Procedure Laterality Date  . Cholecystectomy    . Cervical disc surgery     Family History: family history is negative for Bipolar disorder, Dementia, OCD, ADD / ADHD, Alcohol abuse, Drug abuse, Anxiety disorder, Depression, Paranoid behavior, Schizophrenia, Seizures, Sexual abuse, and Physical abuse. Reviewed and nothing new today.   Mental status examination Patient is casually dressed and fairly groomed.  She is obese and uses wheelchair.  She has multiple scratch marks and bruises and her arm which are chronic.  Her speech is slow but clear and coherent.  She has poverty of of thought content however there is no paranoia or delusion or psychotic symptoms.  Her attention and concentration is fair.  She denies any active or passive suicidal thinking and homicidal thinking.  She described her mood is neutral and her affect is mood congruent.  She denies any auditory or visual hallucination.  There no psychotic symptoms present at this time.  Her attention and concentration is fair. She's alert and oriented x3. Her insight judgment and impulse control is okay  Lab Results: No results found for this or any previous visit (from the past 8736 hour(s)). PCP draws her labs with no concerns noted.  Will have the PCP draw the Lithium level.  Assessment Axis I Bipolar disorder Axis II deferred Axis III see medical history Axis IV mild to moderate Axis V 50-55  Plan/Discussion: I took her vitals.  I reviewed CC, tobacco/med/surg Hx, meds effects/  side effects, problem list, therapies and responses as well as current situation/symptoms discussed options. Continue current effective medications. According to the husband the labs for these medications have been done in her primary care office and we'll have them sent over. She'll return to see me in 3 months See orders and pt instructions for more details.  Medical Decision Making Problem Points:   Established problem, stable/improving (1), Review of last therapy session (1) and Review of psycho-social stressors (1) Data Points:  Review or order clinical lab tests (1) Review of medication regiment & side effects (2)  I certify that outpatient services furnished can reasonably be expected to improve the patient's condition.   Diannia Ruder, MD

## 2012-10-24 ENCOUNTER — Other Ambulatory Visit (HOSPITAL_COMMUNITY): Payer: Self-pay | Admitting: Psychiatry

## 2012-10-24 DIAGNOSIS — F3130 Bipolar disorder, current episode depressed, mild or moderate severity, unspecified: Secondary | ICD-10-CM

## 2012-10-24 DIAGNOSIS — F3162 Bipolar disorder, current episode mixed, moderate: Secondary | ICD-10-CM

## 2012-10-24 NOTE — Patient Instructions (Signed)
Pt labs from pcp reviewed..bun and creatinine are elevated. No lithium level or depakote level. Need to do these levels and repeat cmp

## 2012-10-24 NOTE — Addendum Note (Signed)
Addended by: Diannia Ruder R on: 10/24/2012 03:12 PM   Modules accepted: Orders

## 2012-10-25 ENCOUNTER — Telehealth (HOSPITAL_COMMUNITY): Payer: Self-pay | Admitting: Psychiatry

## 2012-11-15 NOTE — Telephone Encounter (Signed)
test

## 2013-01-20 ENCOUNTER — Ambulatory Visit (HOSPITAL_COMMUNITY): Payer: Self-pay | Admitting: Psychiatry

## 2013-02-13 ENCOUNTER — Ambulatory Visit (INDEPENDENT_AMBULATORY_CARE_PROVIDER_SITE_OTHER): Payer: 59 | Admitting: Psychiatry

## 2013-02-13 ENCOUNTER — Encounter (HOSPITAL_COMMUNITY): Payer: Self-pay | Admitting: Psychiatry

## 2013-02-13 DIAGNOSIS — F319 Bipolar disorder, unspecified: Secondary | ICD-10-CM

## 2013-02-13 DIAGNOSIS — F3189 Other bipolar disorder: Secondary | ICD-10-CM

## 2013-02-13 MED ORDER — LITHIUM CARBONATE 300 MG PO TABS: ORAL_TABLET | ORAL | Status: DC

## 2013-02-13 MED ORDER — DOXEPIN HCL 50 MG PO CAPS: ORAL_CAPSULE | ORAL | Status: DC

## 2013-02-13 NOTE — Progress Notes (Signed)
Patient ID: Cassandra Klein, female   DOB: March 04, 1945, 68 y.o.   MRN: [id number] Patient ID: Cassandra Klein, female   DOB: March 04, 1945, 68 y.o.   MRN: [id number] [facility] [id number] Progress Note Cassandra Klein MRN: [id number] DOB: March 04, 1945 Age: 68 y.o.  Date: 02/13/2013 Start Time: 10:55 AM End Time: 11:05 AM  Chief Complaint: Chief Complaint  Patient presents with  . Depression  . Follow-up   Subjective: "I'm doing good." This patient is a 68 year old married white female who lives with her husband in [place]. She is a retired Chartered loss adjusterschoolteacher.  Apparently the patient began having mood problems in her 30s. She had significant mood swings. She's been on medication ever since and feels quite stable on the doses that she takes now. She's sleeping well and denies being depressed or manic. Her husband is in agreement. Both of them seem very frustrated with having to come here and answer questions so it is somewhat  difficult to get much of a history. The patient is unable to walk due to severe neuropathy and is wheelchair-bound. Her husband is her primary caretaker. She spends most of her time watching TV and reading. She claims that she's content with her current dosages  The patient returns after 3 months. She's still in a wheelchair and is here with her husband. She[facility]. Her valproic acid level is 18 and her lithium level is 0.56. Despite his relatively low levels she feels good and her mood is been stable. She's been sleeping well at night on the doxepin. She and her husband have very little to relate. She's not suicidal and denies auditory or visual hallucinations  Current psychiatric medication Doxepin 50 mg 5 at bedtime Lithium 300 mg half twice a day  Depakote 250 mg twice a day prescribed by her primary care physician.  Vitals: There were no vitals taken for this visit.  Allergies: Allergies  Allergen Reactions  .  Tetanus Toxoids Anaphylaxis  . Codeine Nausea And Vomiting   Medical History: Past Medical History  Diagnosis Date  . Diabetes mellitus type II   . Hyperlipemia   . HTN (hypertension)   . CRI (chronic renal insufficiency)   . Neuropathic pain   . Thyroid disease   . Vitamin D deficiency   . Gastroparesis   . Anemia   . Bipolar 1 disorder   Patient has history of hyperlipidemia, hypertension, chronic renal insufficiency, gastroparesis, vitamin D deficiency and neuropathic pain. She uses wheelchair for ambulation.  Her primary care physician is Dr. Sherryll [place]Shah in [place].  Surgical History: Past Surgical History  Procedure Laterality Date  . Cholecystectomy    . Cervical disc surgery     Family History: family history is negative for Bipolar disorder, Dementia, OCD, ADD / ADHD, Alcohol abuse, Drug abuse, Anxiety disorder, Depression, Paranoid behavior, Schizophrenia, Seizures, Sexual abuse, and Physical abuse. Reviewed and nothing new today.   Mental status examination Patient is casually dressed and fairly groomed.  She is obese and uses wheelchair.  She has multiple scratch marks and bruises and her arm which are chronic.  Her speech is slow but clear and coherent.  She has poverty of of thought content however there is no paranoia or delusion or psychotic symptoms.  Her attention and concentration is fair.  She denies any active or passive suicidal thinking and homicidal thinking.  She described her mood is neutral and her affect is mood congruent.  She denies any auditory  or visual hallucination.  There no psychotic symptoms present at this time.  Her attention and concentration is fair. She's alert and oriented x3. Her insight judgment and impulse control is okay  Lab Results: No results found for this or any previous visit (from the past 8736 hour(s)). PCP draws her labs with no concerns noted.  Will have the PCP draw the Lithium level.  Assessment Axis I Bipolar disorder Axis II  deferred Axis III see medical history Axis IV mild to moderate Axis V 50-55  Plan/Discussion: I took her vitals.  I reviewed CC, tobacco/med/surg Hx, meds effects/ side effects, problem list, therapies and responses as well as current situation/symptoms discussed options. Continue current effective medications.She'll return to see me in 5 months See orders and pt instructions for more details.  Medical Decision Making Problem Points:  Established problem, stable/improving (1), Review of last therapy session (1) and Review of psycho-social stressors (1) Data Points:  Review or order clinical lab tests (1) Review of medication regiment & side effects (2)  I certify that outpatient services furnished can reasonably be expected to improve the patient's condition.   Diannia Ruder, MD

## 2013-02-16 ENCOUNTER — Ambulatory Visit (HOSPITAL_COMMUNITY): Payer: Self-pay | Admitting: Psychiatry

## 2013-07-14 ENCOUNTER — Ambulatory Visit (INDEPENDENT_AMBULATORY_CARE_PROVIDER_SITE_OTHER): Payer: 59 | Admitting: Psychiatry

## 2013-07-14 ENCOUNTER — Encounter (HOSPITAL_COMMUNITY): Payer: Self-pay | Admitting: Psychiatry

## 2013-07-14 DIAGNOSIS — F319 Bipolar disorder, unspecified: Secondary | ICD-10-CM

## 2013-07-14 DIAGNOSIS — F3189 Other bipolar disorder: Secondary | ICD-10-CM

## 2013-07-14 MED ORDER — LITHIUM CARBONATE 300 MG PO TABS: ORAL_TABLET | ORAL | Status: DC

## 2013-07-14 MED ORDER — DOXEPIN HCL 50 MG PO CAPS: ORAL_CAPSULE | ORAL | Status: DC

## 2013-07-14 NOTE — Progress Notes (Signed)
Patient ID: Cassandra Klein, female   DOB: 11-02-1945, 68 y.o.   MRN: [id number] Patient ID: Cassandra Klein, female   DOB: 11-02-1945, 68 y.o.   MRN: [id number] Patient ID: Cassandra Klein, female   DOB: 11-02-1945, 68 y.o.   MRN: [id number] [facility] [id number] Progress Note Cassandra Klein MRN: [id number] DOB: 11-02-1945 Age: 68 y.o.  Date: 07/14/2013 Start Time: 10:55 AM End Time: 11:05 AM  Chief Complaint: Chief Complaint  Patient presents with  . Depression  . Manic Behavior  . Follow-up   Subjective: "I'm doing good." This patient is a 68 year old married white female who lives [place] her [place] in [place]. She is a retired Chartered loss adjusterschoolteacher.  Apparently the patient began having mood problems in her 30s. She had significant mood swings. She's been on medication ever since and feels quite stable on the doses that she takes now. She's sleeping well and denies being depressed or manic. Her husband is in agreement. Both of them seem very frustrated with having to come here and answer questions so it is somewhat  difficult to get much of a history. The patient is unable to walk due to severe neuropathy and is wheelchair-bound. Her husband is her primary caretaker. She spends most of her time watching TV and reading. She claims that she's content with her current dosages  The patient returns after 4 months. She is here with her husband and she is still wheelchair bound. She has numerous bruises all over and her husband is not sure why this is happening. He brought in her recent labs and her lithium level is 0.56 and valproate acid level is 0.17. Her mood has been good and she seems happy despite her debilitated condition. She's sleeping well and spends her time reading and watching TV.  Current psychiatric medication Doxepin 50 mg 5 at bedtime Lithium 300 mg half twice a day  Depakote 250 mg twice a day prescribed by her primary care physician.  Vitals: There were no vitals taken for this  visit.  Allergies: Allergies  Allergen Reactions  . Tetanus Toxoids Anaphylaxis  . Codeine Nausea And Vomiting   Medical History: Past Medical History  Diagnosis Date  . Diabetes mellitus type II   . Hyperlipemia   . HTN (hypertension)   . CRI (chronic renal insufficiency)   . Neuropathic pain   . Thyroid disease   . Vitamin D deficiency   . Gastroparesis   . Anemia   . Bipolar 1 disorder   Patient has history of hyperlipidemia, hypertension, chronic renal insufficiency, gastroparesis, vitamin D deficiency and neuropathic pain. She uses wheelchair for ambulation.  Her primary care physician is Dr. Sherryll BurgerShah in [place].  Surgical History: Past Surgical History  Procedure Laterality Date  . Cholecystectomy    . Cervical disc surgery     Family History: family history is negative for Bipolar disorder, Dementia, OCD, ADD / ADHD, Alcohol abuse, Drug abuse, Anxiety disorder, Depression, Paranoid behavior, Schizophrenia, Seizures, Sexual abuse, and Physical abuse. Reviewed and nothing new today.   Mental status examination Patient is casually dressed and fairly groomed.  She is obese and uses wheelchair.  She has multiple scratch marks and bruises and her arm which are chronic.  Her speech is slow but clear and coherent.  She has poverty of of thought content however there is no paranoia or delusion or psychotic symptoms.  Her attention and concentration is fair.  She denies any active or passive suicidal thinking and homicidal thinking.  She described her mood is neutral and her affect is mood congruent.  She denies any auditory or visual hallucination.  There no psychotic symptoms present at this time.  Her attention and concentration is fair. She's alert and oriented x3. Her insight judgment and impulse control is okay  Lab Results: No results found for this or any previous visit (from the past 8736 hour(s)). PCP draws her labs with no concerns noted.  Will have the PCP draw the Lithium  level.  Assessment Axis I Bipolar disorder Axis II deferred Axis III see medical history Axis IV mild to moderate Axis V 50-55  Plan/Discussion: I took her vitals.  I reviewed CC, tobacco/med/surg Hx, meds effects/ side effects, problem list, therapies and responses as well as current situation/symptoms discussed options. Continue current effective medications.She'll return to see me in 5 months See orders and pt instructions for more details.  Medical Decision Making Problem Points:  Established problem, stable/improving (1), Review of last therapy session (1) and Review of psycho-social stressors (1) Data Points:  Review or order clinical lab tests (1) Review of medication regiment & side effects (2)  I certify that outpatient services furnished can reasonably be expected to improve the patient's condition.   Diannia RuderOSS, DEBORAH, MD

## 2013-12-14 ENCOUNTER — Encounter (HOSPITAL_COMMUNITY): Payer: Self-pay | Admitting: Psychiatry

## 2013-12-14 ENCOUNTER — Ambulatory Visit (INDEPENDENT_AMBULATORY_CARE_PROVIDER_SITE_OTHER): Payer: 59 | Admitting: Psychiatry

## 2013-12-14 VITALS — BP 110/50 | HR 77 | Ht 60.0 in

## 2013-12-14 DIAGNOSIS — F3162 Bipolar disorder, current episode mixed, moderate: Secondary | ICD-10-CM

## 2013-12-14 DIAGNOSIS — F319 Bipolar disorder, unspecified: Secondary | ICD-10-CM

## 2013-12-14 MED ORDER — DOXEPIN HCL 50 MG PO CAPS: ORAL_CAPSULE | ORAL | Status: DC

## 2013-12-14 MED ORDER — LITHIUM CARBONATE 300 MG PO TABS: ORAL_TABLET | ORAL | Status: DC

## 2013-12-14 NOTE — Progress Notes (Signed)
Patient ID: Cassandra Klein, female   DOB: 07-21-1945, 68 y.o.   MRN: [id number] Patient ID: Cassandra Klein, female   DOB: 07-21-1945, 68 y.o.   MRN: [id number] Patient ID: Cassandra Klein, female   DOB: 07-21-1945, 68 y.o.   MRN: [id number] Patient ID: Cassandra Klein, female   DOB: 07-21-1945, 68 y.o.   MRN: [id number] [facility] [id number] Progress Note Cassandra Klein MRN: [id number] DOB: 07-21-1945 Age: 68 y.o.  Date: 12/14/2013 Start Time: 10:55 AM End Time: 11:05 AM  Chief Complaint: Chief Complaint  Patient presents with  . Depression  . Anxiety  . Follow-up   Subjective: "I'm doing good." This patient is a 68 year old married white female who lives with her husband in [place]. She is a retired Chartered loss adjusterschoolteacher.  Apparently the patient began having mood problems in her 30s. She had significant mood swings. She's been on medication ever since and feels quite stable on the doses that she takes now. She's sleeping well and denies being depressed or manic. Her husband is in agreement. Both of them seem very frustrated with having to come here and answer questions so it is somewhat  difficult to get much of a history. The patient is unable to walk due to severe neuropathy and is wheelchair-bound. Her husband is her primary caretaker. She spends most of her time watching TV and reading. She claims that she's content with her current dosages  The patient returns after 5 months. She is here with her husband. She still no wheelchair and has a bad bedsores so she is in a lot of pain. Mentally she continues to do well for the most part and she is sleeping well. She denies suicidal ideation her mood is been stable. Current psychiatric medication Doxepin 50 mg 5 at bedtime Lithium 300 mg half twice a day  Depakote 250 mg twice a day prescribed by her primary care physician.  Vitals: BP 110/50 mmHg  Pulse 77  Ht 5' (1.524 m)  Wt   Allergies: Allergies  Allergen Reactions  . Tetanus  Toxoids Anaphylaxis  . Codeine Nausea And Vomiting   Medical History: Past Medical History  Diagnosis Date  . Diabetes mellitus type II   . Hyperlipemia   . HTN (hypertension)   . CRI (chronic renal insufficiency)   . Neuropathic pain   . Thyroid disease   . Vitamin D deficiency   . Gastroparesis   . Anemia   . Bipolar 1 disorder   Patient has history of hyperlipidemia, hypertension, chronic renal insufficiency, gastroparesis, vitamin D deficiency and neuropathic pain. She uses wheelchair for ambulation.  Her primary care physician is Dr. Sherryll BurgerShah in [place].  Surgical History: Past Surgical History  Procedure Laterality Date  . Cholecystectomy    . Cervical disc surgery     Family History: family history is negative for Bipolar disorder, Dementia, OCD, ADD / ADHD, Alcohol abuse, Drug abuse, Anxiety disorder, Depression, Paranoid behavior, Schizophrenia, Seizures, Sexual abuse, and Physical abuse. Reviewed and nothing new today.   Mental status examination Patient is casually dressed and fairly groomed.  She is obese and uses wheelchair.  She has multiple scratch marks and bruises and her arm which are chronic.  Her speech is slow but clear and coherent.  She has poverty of of thought content however there is no paranoia or delusion or psychotic symptoms.  Her attention and concentration is fair.  She denies any active or passive suicidal thinking and homicidal thinking.  She described her  mood is neutral and her affect is mood congruent.  She denies any auditory or visual hallucination.  There no psychotic symptoms present at this time.  Her attention and concentration is fair. She's alert and oriented x3. Her insight judgment and impulse control is okay  Lab Results: No results found for this or any previous visit (from the past 8736 hour(s)). PCP draws her labs with no concerns noted.  Will have the PCP draw the Lithium level, Depakote level and sends him to us  Assessment Axis I  Bipolar disorder Axis II deferred Axis III see medical history Axis IV mild to moderate Axis V 50-55  Plan/Discussion: I took her vitals.  I reviewed CC, tobacco/med/surg Hx, meds effects/ side effects, problem list, therapies and responses as well as current situation/symptoms discussed options. Continue current effective medications.She'll return to see me in 6 months See orders and pt instructions for more details.  Medical Decision Making Problem Points:  Established problem, stable/improving (1), Review of last therapy session (1) and Review of psycho-social stressors (1) Data Points:  Review or order clinical lab tests (1) Review of medication regiment & side effects (2)  I certify that outpatient services furnished can reasonably be expected to improve the patient's condition.   Diannia RuderOSS, DEBORAH, MD

## 2014-05-15 ENCOUNTER — Encounter (HOSPITAL_COMMUNITY): Payer: Self-pay | Admitting: Psychiatry

## 2014-05-15 ENCOUNTER — Ambulatory Visit (INDEPENDENT_AMBULATORY_CARE_PROVIDER_SITE_OTHER): Payer: 59 | Admitting: Psychiatry

## 2014-05-15 DIAGNOSIS — F319 Bipolar disorder, unspecified: Secondary | ICD-10-CM | POA: Diagnosis not present

## 2014-05-15 DIAGNOSIS — F3162 Bipolar disorder, current episode mixed, moderate: Secondary | ICD-10-CM

## 2014-05-15 MED ORDER — DIVALPROEX SODIUM 250 MG PO DR TAB: 250.0000 mg | DELAYED_RELEASE_TABLET | Freq: Two times a day (BID) | ORAL | Status: DC

## 2014-05-15 MED ORDER — LITHIUM CARBONATE 300 MG PO TABS: ORAL_TABLET | ORAL | Status: DC

## 2014-05-15 NOTE — Progress Notes (Signed)
Patient ID: Cassandra Klein, female   DOB: [birth year], 69 y.o.   MRN: [id number] Patient ID: Cassandra Klein, female   DOB: [birth year], [age] y.o.   MRN: [id number] Patient ID: Cassandra Klein, female   DOB: [birth year], 69 y.o.   MRN: [id number] Patient ID: Cassandra Klein, female   DOB: [birth year], 69 y.o.   MRN: [id number] Patient ID: Cassandra Klein, female   DOB: [birth year], 69 y.o.   MRN: [id number] [facility] [id number] Progress Note Cassandra Klein MRN: [id number] DOB: [birth year] Age: 69 y.o.  Date: 05/15/2014 Start Time: 10:55 AM End Time: 11:05 AM  Chief Complaint: Chief Complaint  Patient presents with  . Depression  . Manic Behavior  . Follow-up   Subjective: "I'm doing good." This patient is a 69 year old married white female who lives with her husband in [place]. She is a retired Chartered loss adjusterschoolteacher.  Apparently the patient began having mood problems in her 30s. She had significant mood swings. She's been on medication ever since and feels quite stable on the doses that she takes now. She's sleeping well and denies being depressed or manic. Her husband is in agreement. Both of them seem very frustrated with having to come here and answer questions so it is somewhat  difficult to get much of a history. The patient is unable to walk due to severe neuropathy and is wheelchair-bound. Her husband is her primary caretaker. She spends most of her time watching TV and reading. She claims that she's content with her current dosages  The patient returns after 5 months. She is here with her husband. She still no wheelchair and complains of a yeast infection which is annoying to her but her husband states that is being treated. Her diabetes is under great control and her A1c is 5.8. Her husband reports that her lithium level is 0.6 and Depakote level is 20. He also reports that her renal function tests were normal. Her mood is been good even though she is wheelchair-bound she enjoys watching TV and  reading Current psychiatric medication Doxepin 50 mg 5 at bedtime Lithium 300 mg half twice a day  Depakote 250 mg twice a day prescribed by her primary care physician.  Vitals: There were no vitals taken for this visit.  Allergies: Allergies  Allergen Reactions  . Tetanus Toxoids Anaphylaxis  . Codeine Nausea And Vomiting   Medical History: Past Medical History  Diagnosis Date  . Diabetes mellitus type II   . Hyperlipemia   . HTN (hypertension)   . CRI (chronic renal insufficiency)   . Neuropathic pain   . Thyroid disease   . Vitamin D deficiency   . Gastroparesis   . Anemia   . Bipolar 1 disorder   Patient has history of hyperlipidemia, hypertension, chronic renal insufficiency, gastroparesis, vitamin D deficiency and neuropathic pain. She uses wheelchair for ambulation.  Her primary care physician is Dr. Sherryll BurgerShah in [place].  Surgical History: Past Surgical History  Procedure Laterality Date  . Cholecystectomy    . Cervical disc surgery     Family History: family history is negative for Bipolar disorder, Dementia, OCD, ADD / ADHD, Alcohol abuse, Drug abuse, Anxiety disorder, Depression, Paranoid behavior, Schizophrenia, Seizures, Sexual abuse, and Physical abuse. Reviewed and nothing new today.   Mental status examination Patient is casually dressed and fairly groomed.  She is obese and uses wheelchair.  Marland Kitchen.  Her speech is slow but clear and coherent.  She is more interactive today and there is  no paranoia or delusion or psychotic symptoms.  Her attention and concentration is fair.  She denies any active or passive suicidal thinking and homicidal thinking.  She described her mood is neutral and her affect is mood congruent.  She denies any auditory or visual hallucination.  There no psychotic symptoms present at this time.  Her attention and concentration is fair. She's alert and oriented x3. Her insight judgment and impulse control is okay  Lab Results: No results found for this  or any previous visit (from the past 8736 hour(s)). PCP draws her labs with no concerns noted.  Will have the PCP draw the Lithium level, Depakote level and sends him to [place]  Assessment Axis I Bipolar disorder Axis II deferred Axis III see medical history Axis IV mild to moderate Axis V 50-55  Plan/Discussion: I took her vitals.  I reviewed CC, tobacco/med/surg Hx, meds effects/ side effects, problem list, therapies and responses as well as current situation/symptoms discussed options. Continue current effective medications.She'll return to see me in 6 months See orders and pt instructions for more details.  Medical Decision Making Problem Points:  Established problem, stable/improving (1), Review of last therapy session (1) and Review of psycho-social stressors (1) Data Points:  Review or order clinical lab tests (1) Review of medication regiment & side effects (2)  I certify that outpatient services furnished can reasonably be expected to improve the patient's condition.   Diannia Ruder, MD

## 2014-11-14 ENCOUNTER — Ambulatory Visit (INDEPENDENT_AMBULATORY_CARE_PROVIDER_SITE_OTHER): Payer: 59 | Admitting: Psychiatry

## 2014-11-14 ENCOUNTER — Encounter (HOSPITAL_COMMUNITY): Payer: Self-pay | Admitting: Psychiatry

## 2014-11-14 VITALS — BP 125/71 | HR 78 | Ht 60.0 in

## 2014-11-14 DIAGNOSIS — F319 Bipolar disorder, unspecified: Secondary | ICD-10-CM

## 2014-11-14 DIAGNOSIS — F3162 Bipolar disorder, current episode mixed, moderate: Secondary | ICD-10-CM | POA: Diagnosis not present

## 2014-11-14 MED ORDER — DOXEPIN HCL 50 MG PO CAPS: ORAL_CAPSULE | ORAL | Status: DC

## 2014-11-14 MED ORDER — LITHIUM CARBONATE 300 MG PO TABS: ORAL_TABLET | ORAL | Status: DC

## 2014-11-14 NOTE — Progress Notes (Signed)
Patient ID: Cassandra Klein, female   DOB: April 26, 1945, 69 y.o.   MRN: [id number] Patient ID: Cassandra Klein, female   DOB: 02-19-1945, 69 y.o.   MRN: [id number] Patient ID: Cassandra Klein, female   DOB: 1945/08/08, 69 y.o.   MRN: [id number] Patient ID: Cassandra Klein, female   DOB: 1945-03-22, 69 y.o.   MRN: [id number] Patient ID: Cassandra Klein, female   DOB: [birth year], 69 y.o.   MRN: [id number] Patient ID: Cassandra Klein, female   DOB: [birth year], 69 y.o.   MRN: [id number] Cassandra Klein [id number] Progress Note Cassandra Klein MRN: [id number] DOB: [birth year] Age: 69 y.o.  Date: 11/14/2014 Start Time: 10:55 AM End Time: 11:05 AM  Chief Complaint: Chief Complaint  Patient presents with  . Depression  . Manic Behavior  . Follow-up   Subjective: "I'm doing good." This patient is a 69 year old married white female who lives with her husband in [place]. She is a retired Chartered loss adjuster.  Apparently the patient began having mood problems in her 30s. She had significant mood swings. She's been on medication ever since and feels quite stable on the doses that she takes now. She's sleeping well and denies being depressed or manic. Her husband is in agreement. Both of them seem very frustrated with having to come here and answer questions so it is somewhat  difficult to get much of a history. The patient is unable to walk due to severe neuropathy and is wheelchair-bound. Her husband is her primary caretaker. She spends most of her time watching TV and reading. She claims that she's content with her current dosages  The patient returns after 5 months. She is here with her husband. She is still wheelchair bound Her diabetes is under great control and her A1c is 5.7. Her husband reports that her lithium level iand Depakote level are both low but he did not bring me the actual numbers today and states he will call them to [place] He also reports that her renal function tests were normal. Her mood is been good  even though she is wheelchair-bound she enjoys watching TV and reading. She has a 34 year old neighbor that likes to visit her and this makes her happy Current psychiatric medication Doxepin 50 mg 5 at bedtime Lithium 300 mg half twice a day  Depakote 250 mg twice a day prescribed by her primary care physician.  Vitals: BP 125/71 mmHg  Pulse 78  Ht 5' (1.524 m)  Wt   SpO2 98%  Allergies: Allergies  Allergen Reactions  . Tetanus Toxoids Anaphylaxis  . Codeine Nausea And Vomiting   Medical History: Past Medical History  Diagnosis Date  . Diabetes mellitus type II   . Hyperlipemia   . HTN (hypertension)   . CRI (chronic renal insufficiency)   . Neuropathic pain   . Thyroid disease   . Vitamin D deficiency   . Gastroparesis   . Anemia   . Bipolar 1 disorder (HCC)   Patient has history of hyperlipidemia, hypertension, chronic renal insufficiency, gastroparesis, vitamin D deficiency and neuropathic pain. She uses wheelchair for ambulation.  Her primary care physician is Dr. Sherryll Burger in [place].  Surgical History: Past Surgical History  Procedure Laterality Date  . Cholecystectomy    . Cervical disc surgery     Family History: family history is negative for Bipolar disorder, Dementia, OCD, ADD / ADHD, Alcohol abuse, Drug abuse, Anxiety disorder, Depression, Paranoid behavior, Schizophrenia, Seizures, Sexual abuse, and Physical abuse. Reviewed and nothing new  today.   Mental status examination Patient is casually dressed and fairly groomed.  She is obese and uses wheelchair.  Marland Kitchen.  Her speech is slow but clear and coherent.  She is more interactive today and there is no paranoia or delusion or psychotic symptoms.  Her attention and concentration is fair.  She denies any active or passive suicidal thinking and homicidal thinking.  She described her mood is good and her affect is mood congruent.  She denies any auditory or visual hallucination.  There no psychotic symptoms present at this  time.  Her attention and concentration is fair. She's alert and oriented x3. Her insight judgment and impulse control is okay  Lab Results: No results found for this or any previous visit (from the past 8736 hour(s)). PCP draws her labs with no concerns noted.  Will have the PCP draw the Lithium level, Depakote level and sends him to us  Assessment Axis I Bipolar disorder Axis II deferred Axis III see medical history Axis IV mild to moderate Axis V 50-55  Plan/Discussion: I took her vitals.  I reviewed CC, tobacco/med/surg Hx, meds effects/ side effects, problem list, therapies and responses as well as current situation/symptoms discussed options. Continue Depakote and lithium for mood stabilization and doxepin for depression She'll return to see me in 6 months See orders and pt instructions for more details.  Medical Decision Making Problem Points:  Established problem, stable/improving (1), Review of last therapy session (1) and Review of psycho-social stressors (1) Data Points:  Review or order clinical lab tests (1) Review of medication regiment & side effects (2)  I certify that outpatient services furnished can reasonably be expected to improve the patient's condition.   Diannia RuderOSS, DEBORAH, MD

## 2015-05-15 ENCOUNTER — Ambulatory Visit (HOSPITAL_COMMUNITY): Payer: Self-pay | Admitting: Psychiatry

## 2015-06-11 ENCOUNTER — Encounter (HOSPITAL_COMMUNITY): Payer: Self-pay | Admitting: Psychiatry

## 2015-06-11 ENCOUNTER — Ambulatory Visit (INDEPENDENT_AMBULATORY_CARE_PROVIDER_SITE_OTHER): Payer: 59 | Admitting: Psychiatry

## 2015-06-11 VITALS — BP 103/50 | HR 78 | Ht 60.0 in

## 2015-06-11 DIAGNOSIS — F3162 Bipolar disorder, current episode mixed, moderate: Secondary | ICD-10-CM

## 2015-06-11 DIAGNOSIS — F319 Bipolar disorder, unspecified: Secondary | ICD-10-CM

## 2015-06-11 MED ORDER — DOXEPIN HCL 50 MG PO CAPS: ORAL_CAPSULE | ORAL | Status: DC

## 2015-06-11 MED ORDER — LITHIUM CARBONATE 300 MG PO TABS: ORAL_TABLET | ORAL | Status: DC

## 2015-06-11 NOTE — Progress Notes (Signed)
Patient ID: Cassandra Klein, female   DOB: 07/30/1945, 70 y.o.   MRN: [id number] Patient ID: Cassandra Klein, female   DOB: Mar 30, 1945, 70 y.o.   MRN: [id number] Patient ID: Cassandra Klein, female   DOB: [birth year], 70 y.o.   MRN: [id number] Patient ID: Cassandra Klein, female   DOB: 07/06/1945, 70 y.o.   MRN: [id number] Patient ID: Cassandra Klein, female   DOB: 05/08/1945, 70 y.o.   MRN: [id number] Patient ID: Cassandra Klein, female   DOB: May 14, 1945, 70 y.o.   MRN: [id number] Patient ID: Cassandra Klein, female   DOB: July 16, 1945, 70 y.o.   MRN: [id number] [facility] [id number] Progress Note Cassandra Klein MRN: [id number] DOB: [birth year] Age: 70 y.o.  Date: 06/11/2015 Start Time: 10:55 AM End Time: 11:05 AM  Chief Complaint: Chief Complaint  Patient presents with  . Depression  . Manic Behavior  . Follow-up   Subjective: "I'm doing good." This patient is a 70 year old married white female who lives with her husband in [place]. She is a retired Chartered loss adjuster.  Apparently the patient began having mood problems in her 30s. She had significant mood swings. She's been on medication ever since and feels quite stable on the doses that she takes now. She's sleeping well and denies being depressed or manic. Her husband is in agreement. Both of them seem very frustrated with having to come here and answer questions so it is somewhat  difficult to get much of a history. The patient is unable to walk due to severe neuropathy and is wheelchair-bound. Her husband is her primary caretaker. She spends most of her time watching TV and reading. She claims that she's content with her current dosages  The patient returns after 5 months. She is here with her husband. She is still wheelchair bound Her diabetes is under great control and her A1c is 5.2. Her husband reports that her lithium level iand Depakote level are both low but he did not bring me the actual numbers today and states Dr. Sherryll Burger will send [place] the  latest labs Her mood is been good even though she is wheelchair-bound she enjoys watching TV and reading. She has a 87 year old neighbor that likes to visit her and this makes her happy Current psychiatric medication Doxepin 50 mg 5 at bedtime Lithium 300 mg half twice a day  Depakote 250 mg twice a day prescribed by her primary care physician.  Vitals: BP 103/50 mmHg  Pulse 78  Ht 5' (1.524 m)  Wt   SpO2 97%  Allergies: Allergies  Allergen Reactions  . Tetanus Toxoids Anaphylaxis  . Codeine Nausea And Vomiting   Medical History: Past Medical History  Diagnosis Date  . Diabetes mellitus type II   . Hyperlipemia   . HTN (hypertension)   . CRI (chronic renal insufficiency)   . Neuropathic pain   . Thyroid disease   . Vitamin D deficiency   . Gastroparesis   . Anemia   . Bipolar 1 disorder (HCC)   Patient has history of hyperlipidemia, hypertension, chronic renal insufficiency, gastroparesis, vitamin D deficiency and neuropathic pain. She uses wheelchair for ambulation.  Her primary care physician is Dr. Sherryll Burger in [place].  Surgical History: Past Surgical History  Procedure Laterality Date  . Cholecystectomy    . Cervical disc surgery     Family History: family history is negative for Bipolar disorder, Dementia, OCD, ADD / ADHD, Alcohol abuse, Drug abuse, Anxiety disorder, Depression, Paranoid behavior, Schizophrenia, Seizures, Sexual  abuse, and Physical abuse. Reviewed and nothing new today.   Mental status examination Patient is casually dressed and fairly groomed.  She is obese and uses wheelchair.  Marland Kitchen.  Her speech is slow but clear and coherent.  She is interactive today and there is no paranoia or delusion or psychotic symptoms.  Her attention and concentration is fair.  She denies any active or passive suicidal thinking and homicidal thinking.  She described her mood is good and her affect is mood congruent.  She denies any auditory or visual hallucination.  There no  psychotic symptoms present at this time.  Her attention and concentration is fair. She's alert and oriented x3. Her insight judgment and impulse control is okay  Lab Results: No results found for this or any previous visit (from the past 8736 hour(s)). PCP draws her labs with no concerns noted.  Will have the PCP draw the Lithium level, Depakote level and sends him to us  Assessment Axis I Bipolar disorder Axis II deferred Axis III see medical history Axis IV mild to moderate Axis V 50-55  Plan/Discussion: I took her vitals.  I reviewed CC, tobacco/med/surg Hx, meds effects/ side effects, problem list, therapies and responses as well as current situation/symptoms discussed options. Continue Depakote and lithium for mood stabilization and doxepin for depression She'll return to see me in 6 months See orders and pt instructions for more details.  Medical Decision Making Problem Points:  Established problem, stable/improving (1), Review of last therapy session (1) and Review of psycho-social stressors (1) Data Points:  Review or order clinical lab tests (1) Review of medication regiment & side effects (2)  I certify that outpatient services furnished can reasonably be expected to improve the patient's condition.   Diannia RuderOSS, Nelia Rogoff, MD

## 2015-12-12 ENCOUNTER — Ambulatory Visit (INDEPENDENT_AMBULATORY_CARE_PROVIDER_SITE_OTHER): Payer: 59 | Admitting: Psychiatry

## 2015-12-12 ENCOUNTER — Encounter (HOSPITAL_COMMUNITY): Payer: Self-pay | Admitting: Psychiatry

## 2015-12-12 VITALS — BP 93/52 | HR 83 | Ht 60.0 in

## 2015-12-12 DIAGNOSIS — F319 Bipolar disorder, unspecified: Secondary | ICD-10-CM

## 2015-12-12 DIAGNOSIS — F3162 Bipolar disorder, current episode mixed, moderate: Secondary | ICD-10-CM

## 2015-12-12 DIAGNOSIS — Z888 Allergy status to other drugs, medicaments and biological substances status: Secondary | ICD-10-CM

## 2015-12-12 MED ORDER — DOXEPIN HCL 50 MG PO CAPS: ORAL_CAPSULE | ORAL | 5 refills | Status: DC

## 2015-12-12 MED ORDER — LITHIUM CARBONATE 300 MG PO TABS: ORAL_TABLET | ORAL | 5 refills | Status: DC

## 2015-12-12 MED ORDER — DIVALPROEX SODIUM 250 MG PO DR TAB: 250.0000 mg | DELAYED_RELEASE_TABLET | Freq: Two times a day (BID) | ORAL | 3 refills | Status: DC

## 2015-12-12 NOTE — Progress Notes (Signed)
Patient ID: Huston FoleySally B [place], female   DOB: January 28, 1946, 70 y.o.   MRN: [id number] Patient ID: Huston FoleySally B Ferrin, female   DOB: January 28, 1946, 70 y.o.   MRN: [id number] Patient ID: Huston FoleySally B Janes, female   DOB: January 28, 1946, 70 y.o.   MRN: [id number] Patient ID: Huston FoleySally B Blackson, female   DOB: January 28, 1946, 70 y.o.   MRN: [id number] Patient ID: Huston FoleySally B Dufrane, female   DOB: January 28, 1946, 70 y.o.   MRN: [id number] Patient ID: Huston FoleySally B Kniskern, female   DOB: January 28, 1946, 70 y.o.   MRN: [id number] Patient ID: Huston FoleySally B Viles, female   DOB: January 28, 1946, 70 y.o.   MRN: [id number] [facility] [id number] Progress Note Huston FoleySally B Hellwig MRN: [id number] DOB: January 28, 1946 Age: 70 y.o.  Date: 12/12/2015 Start Time: 10:55 AM End Time: 11:05 AM  Chief Complaint: Chief Complaint  Patient presents with  . Manic Behavior  . Depression  . Follow-up   Subjective: "I'm doing good." This patient is a [age] year old married white female who lives with her husband in [place]. She is a retired Chartered loss adjusterschoolteacher.  Apparently the patient began having mood problems in her 30s. She had significant mood swings. She's been on medication ever since and feels quite stable on the doses that she takes now. She's sleeping well and denies being depressed or manic. Her husband is in agreement. Both of them seem very frustrated with having to come here and answer questions so it is somewhat  difficult to get much of a history. The patient is unable to walk due to severe neuropathy and is wheelchair-bound. Her husband is her primary caretaker. She spends most of her time watching TV and reading. She claims that she's content with her current dosages  The patient returns after 6 months. She is here with her husband. She is still wheelchair bound Her diabetes is under  Her husband reports that her lithium level iand Depakote level are both low but he did not bring me the actual numbers Will have to get these from her endocrinologist Her mood is been good even  though she is wheelchair-bound she enjoys watching TV and reading. She has a 71 year old neighbor that likes to visit her and this makes her happy Current psychiatric medication Doxepin 50 mg 5 at bedtime Lithium 300 mg half twice a day  Depakote 250 mg twice a day prescribed by her primary care physician.  Vitals: BP (!) 93/52   Pulse 83   Ht 5' (1.524 m)   Allergies: Allergies  Allergen Reactions  . Tetanus Toxoids Anaphylaxis  . Codeine Nausea And Vomiting   Medical History: Past Medical History:  Diagnosis Date  . Anemia   . Bipolar 1 disorder (HCC)   . CRI (chronic renal insufficiency)   . Diabetes mellitus type II   . Gastroparesis   . HTN (hypertension)   . Hyperlipemia   . Neuropathic pain   . Thyroid disease   . Vitamin D deficiency   Patient has history of hyperlipidemia, hypertension, chronic renal insufficiency, gastroparesis, vitamin D deficiency and neuropathic pain. She uses wheelchair for ambulation.  Her primary care physician is Dr. Sherryll BurgerShah in [place].  Surgical History: Past Surgical History:  Procedure Laterality Date  . CERVICAL DISC SURGERY    . CHOLECYSTECTOMY     Family History: family history is not on file. Reviewed and nothing new today.   Mental status examination Patient is casually dressed and fairly groomed.  She is obese and uses wheelchair.  Marland Kitchen.  Her speech is  slow but clear and coherent.  She is interactive today and there is no paranoia or delusion or psychotic symptoms.  Her attention and concentration is fair.  She denies any active or passive suicidal thinking and homicidal thinking.  She described her mood is good and her affect is mood congruent.  She denies any auditory or visual hallucination.  There no psychotic symptoms present at this time.  Her attention and concentration is fair. She's alert and oriented x3. Her insight judgment and impulse control is okay  Lab Results: No results found for this or any previous visit (from the past  8736 hour(s)). PCP draws her labs with no concerns noted.  Will have the PCP draw the Lithium level, Depakote level and sends him to us  Assessment Axis I Bipolar disorder Axis II deferred Axis III see medical history Axis IV mild to moderate Axis V 50-55  Plan/Discussion: I took her vitals.  I reviewed CC, tobacco/med/surg Hx, meds effects/ side effects, problem list, therapies and responses as well as current situation/symptoms discussed options. Continue Depakote and lithium for mood stabilization and doxepin for depression She'll return to see me in 6 months See orders and pt instructions for more details. Her have her labs sent over from endocrinology  Medical Decision Making Problem Points:  Established problem, stable/improving (1), Review of last therapy session (1) and Review of psycho-social stressors (1) Data Points:  Review or order clinical lab tests (1) Review of medication regiment & side effects (2)  I certify that outpatient services furnished can reasonably be expected to improve the patient's condition.   Diannia RuderOSS, DEBORAH, MDPatient ID: Huston FoleySally B Lipps, female   DOB: Feb 18, 1945, 70 y.o.   MRN: [id number]

## 2015-12-23 ENCOUNTER — Other Ambulatory Visit (HOSPITAL_COMMUNITY): Payer: Self-pay | Admitting: Internal Medicine

## 2015-12-23 DIAGNOSIS — Z1231 Encounter for screening mammogram for malignant neoplasm of breast: Secondary | ICD-10-CM

## 2016-01-08 ENCOUNTER — Ambulatory Visit (HOSPITAL_COMMUNITY)
Admission: RE | Admit: 2016-01-08 | Discharge: 2016-01-08 | Disposition: A | Discharge status: Home / Self Care | Payer: Medicare (Managed Care) | Source: Ambulatory Visit | Attending: Internal Medicine | Admitting: Internal Medicine

## 2016-01-08 DIAGNOSIS — Z1231 Encounter for screening mammogram for malignant neoplasm of breast: Secondary | ICD-10-CM

## 2016-01-17 ENCOUNTER — Telehealth (HOSPITAL_COMMUNITY): Payer: Self-pay

## 2016-01-17 NOTE — Telephone Encounter (Signed)
This patient's husband contacted out office asking to speak with a Physical Therapist to ascertain whether we felt his wife would benefit from PT to improve her AMB. PT is returning call and discussed with patient and husband that based on some basic subjective information, a return to household distance AMB is difficult to predict, but that PT could serve a much better role in improving safety and independence with bed mobility, toilet transfers, car transfer, and guidelines on how to maintain physical activity over the next 20 years so that morbid obesity and inactivity do not create strong contributions to progression and complication or co morbidities. I acknowledged that AMB is important to them, but that quality of life may be the greater benefit of addressing other mobility deficits. At end of conversation the patient and husband reported that they would have to discuss further before deciding. I encouraged her to discuss further with her PCP.   11:16 AM, 01/17/16 Rosamaria LintsAllan C Buccola, PT, DPT Physical Therapist at [facility] [phone] (office)

## 2016-03-12 ENCOUNTER — Ambulatory Visit (HOSPITAL_COMMUNITY): Payer: Self-pay | Admitting: Psychiatry

## 2016-04-09 ENCOUNTER — Ambulatory Visit (HOSPITAL_COMMUNITY): Payer: Self-pay | Admitting: Psychiatry

## 2016-06-11 ENCOUNTER — Encounter (HOSPITAL_COMMUNITY): Payer: Self-pay | Admitting: Psychiatry

## 2016-06-11 ENCOUNTER — Ambulatory Visit (INDEPENDENT_AMBULATORY_CARE_PROVIDER_SITE_OTHER): Payer: 59 | Admitting: Psychiatry

## 2016-06-11 VITALS — BP 105/61 | HR 77 | Ht 60.0 in

## 2016-06-11 DIAGNOSIS — F3162 Bipolar disorder, current episode mixed, moderate: Secondary | ICD-10-CM

## 2016-06-11 DIAGNOSIS — F319 Bipolar disorder, unspecified: Secondary | ICD-10-CM

## 2016-06-11 MED ORDER — DIVALPROEX SODIUM 250 MG PO DR TAB: 250.0000 mg | DELAYED_RELEASE_TABLET | Freq: Two times a day (BID) | ORAL | 3 refills | Status: DC

## 2016-06-11 MED ORDER — LITHIUM CARBONATE 300 MG PO TABS: ORAL_TABLET | ORAL | 5 refills | Status: DC

## 2016-06-11 MED ORDER — DOXEPIN HCL 50 MG PO CAPS: ORAL_CAPSULE | ORAL | 5 refills | Status: DC

## 2016-06-11 NOTE — Progress Notes (Signed)
Patient ID: Cassandra FoleySally B Helvey, female   DOB: 07-22-1945, 71 y.o.   MRN: [id number] Patient ID: Cassandra FoleySally B Pavlovich, female   DOB: 07-22-1945, 71 y.o.   MRN: [id number] Patient ID: Cassandra FoleySally B Cobb, female   DOB: 07-22-1945, 71 y.o.   MRN: [id number] Patient ID: Cassandra FoleySally B Felan, female   DOB: 07-22-1945, 71 y.o.   MRN: [id number] Patient ID: Cassandra FoleySally B Sikorski, female   DOB: 07-22-1945, 71 y.o.   MRN: [id number] Patient ID: Cassandra FoleySally B Tobon, female   DOB: 07-22-1945, 71 y.o.   MRN: [id number] Patient ID: Cassandra FoleySally B Loose, female   DOB: 07-22-1945, 71 y.o.   MRN: [id number] [id number] Progress Note Cassandra FoleySally B Asfaw MRN: [id number] DOB: 07-22-1945 Age: 71 y.o.  Date: 06/11/2016 Start Time: 10:55 AM End Time: 11:05 AM  Chief Complaint: Chief Complaint  Patient presents with  . Depression  . Anxiety   Subjective: "I'm doing good." This patient is a 71 year old married white female who lives with her husband in [place]. She is a retired Chartered loss adjusterschoolteacher.  Apparently the patient began having mood problems in her 30s. She had significant mood swings. She's been on medication ever since and feels quite stable on the doses that she takes now. She's sleeping well and denies being depressed or manic. Her husband is in agreement. Both of them seem very frustrated with having to come here and answer questions so it is somewhat  difficult to get much of a history. The patient is unable to walk due to severe neuropathy and is wheelchair-bound. Her husband is her primary caretaker. She spends most of her time watching TV and reading. She claims that she's content with her current dosages  The patient returns after 6 months. She is here with her husband and son. She states that her right hip has been hurting a lot and she recently had to get a steroid injection in her arm but it has not helped all that much. She's gone back to her primary care physician tomorrow. She is going to recheck her lithium and Depakote levels in  one month. Her last level on 35 showed lithium level of 0.6 and valproic acid of 32. These levels are bit low but she is feeling well on them She states that her mood is good despite the chronic pain. She takes Percocet but tries to minimize the amount that she takes. He doesn't report any manic or depressive symptoms and when she is not in pain she is sleeping pretty well Doxepin 50 mg 5 at bedtime Lithium 300 mg half twice a day  Depakote 250 mg twice a day prescribed by her primary care physician.  Vitals: BP 105/61   Pulse 77   Ht 5' (1.524 m)   Allergies: Allergies  Allergen Reactions  . Tetanus Toxoids Anaphylaxis  . Codeine Nausea And Vomiting   Medical History: Past Medical History:  Diagnosis Date  . Anemia   . Bipolar 1 disorder (HCC)   . CRI (chronic renal insufficiency)   . Diabetes mellitus type II   . Gastroparesis   . HTN (hypertension)   . Hyperlipemia   . Neuropathic pain   . Thyroid disease   . Vitamin D deficiency   Patient has history of hyperlipidemia, hypertension, chronic renal insufficiency, gastroparesis, vitamin D deficiency and neuropathic pain. She uses wheelchair for ambulation.  Her primary care physician is Dr. [place]ryll [place]Shah in [place].  Surgical History: Past Surgical History:  Procedure Laterality Date  . CERVICAL DISC SURGERY    .  CHOLECYSTECTOMY     Family History: family history is not on file. Reviewed and nothing new today.   Mental status examination Patient is casually dressed and fairly groomed.  She is obese and uses wheelchair.  Marland Kitchen  Her speech is slow but clear and coherent.  She is interactive today and there is no paranoia or delusion or psychotic symptoms.  Her attention and concentration is fair.  She denies any active or passive suicidal thinking and homicidal thinking.  She described her mood is good and her affect is mood congruent. She does report being in pain however She denies any auditory or visual hallucination.  There no psychotic  symptoms present at this time.  Her attention and concentration is fair. She's alert and oriented x3. Her insight judgment and impulse control is okay  Lab Results: No results found for this or any previous visit (from the past 8736 hour(s)). See history of present illness  Assessment Axis I Bipolar disorder Axis II deferred Axis III see medical history Axis IV mild to moderate Axis V 50-55  Plan/Discussion: I took her vitals.  I reviewed CC, tobacco/med/surg Hx, meds effects/ side effects, problem list, therapies and responses as well as current situation/symptoms discussed options. Continue Depakote and lithium for mood stabilization and doxepin for depression She'll return to see me in 6 months See orders and pt instructions for more details. Her labs are reviewed through care [facility] at [facility]  Medical Decision Making Problem Points:  Established problem, stable/improving (1), Review of last therapy session (1) and Review of psycho-social stressors (1) Data Points:  Review or order clinical lab tests (1) Review of medication regiment & side effects (2)  I certify that outpatient services furnished can reasonably be expected to improve the patient's condition.   Diannia Ruder, MDPatient ID: Cassandra Klein, female   DOB: [birth year], 71 y.o.   MRN: [id number]

## 2016-12-08 ENCOUNTER — Ambulatory Visit (HOSPITAL_COMMUNITY): Payer: Self-pay | Admitting: Psychiatry

## 2016-12-31 ENCOUNTER — Ambulatory Visit (HOSPITAL_COMMUNITY): Payer: 59 | Admitting: Psychiatry

## 2016-12-31 ENCOUNTER — Encounter (HOSPITAL_COMMUNITY): Payer: Self-pay | Admitting: Psychiatry

## 2016-12-31 VITALS — BP 102/62 | HR 98 | Ht 60.0 in

## 2016-12-31 DIAGNOSIS — M542 Cervicalgia: Secondary | ICD-10-CM

## 2016-12-31 DIAGNOSIS — M549 Dorsalgia, unspecified: Secondary | ICD-10-CM

## 2016-12-31 DIAGNOSIS — F3162 Bipolar disorder, current episode mixed, moderate: Secondary | ICD-10-CM | POA: Diagnosis not present

## 2016-12-31 DIAGNOSIS — R450 Nervousness: Secondary | ICD-10-CM | POA: Diagnosis not present

## 2016-12-31 DIAGNOSIS — F319 Bipolar disorder, unspecified: Secondary | ICD-10-CM

## 2016-12-31 DIAGNOSIS — F419 Anxiety disorder, unspecified: Secondary | ICD-10-CM

## 2016-12-31 MED ORDER — DIVALPROEX SODIUM 250 MG PO DR TAB: 250.0000 mg | DELAYED_RELEASE_TABLET | Freq: Two times a day (BID) | ORAL | 3 refills | Status: AC

## 2016-12-31 MED ORDER — DOXEPIN HCL 50 MG PO CAPS: ORAL_CAPSULE | ORAL | 5 refills | Status: AC

## 2016-12-31 MED ORDER — LITHIUM CARBONATE 300 MG PO TABS: ORAL_TABLET | ORAL | 5 refills | Status: AC

## 2016-12-31 NOTE — Progress Notes (Signed)
BH MD/PA/NP OP Progress Note  12/31/2016 1:54 PM [name]  MRN:  [id number]  Chief Complaint:  Chief Complaint    Depression; Anxiety; Follow-up     HPI: This patient is a 71 year old married white female who lives [place] her [place] in [place]. She is a retired Chartered loss adjusterschoolteacher.  Apparently the patient began having mood problems in her 30s. She had significant mood swings. She's been on medication ever since and feels quite stable on the doses that she takes now. She's sleeping well and denies being depressed or manic. Her husband is in agreement. Both of them seem very frustrated with having to come here and answer questions so it is somewhat  difficult to get much of a history. The patient is unable to walk due to severe neuropathy and is wheelchair-bound. Her husband is her primary caretaker. She spends most of her time watching TV and reading. She claims that she's content with her current dosages  Patient returns with just her son today.  She is back after 6 months.  She states that her husband has been ill and his COPD is worsening.  The son reports that both of them seem to be on the decline and they may be looking at nursing home placement.  The patient herself seems more disheveled and never confused and her short-term memory is poor.  She keeps talking over and over again about having to get a flu shot today.  Intermittently she can answer questions appropriately.  She claims her mood is good and that she is sleeping fairly well. Visit Diagnosis:    ICD-10-CM   1. Bipolar 1 disorder (HCC) F31.9   2. Bipolar 1 disorder, mixed, moderate (HCC) F31.62 lithium 300 MG tablet    doxepin (SINEQUAN) 50 MG capsule    Lithium level    Valproic Acid level    Past Psychiatric History: Past history of significant mood problems in her 30s  Past Medical History:  Past Medical History:  Diagnosis Date  . Anemia   . Bipolar 1 disorder (HCC)   . CRI (chronic renal insufficiency)   . Diabetes  mellitus type II   . Gastroparesis   . HTN (hypertension)   . Hyperlipemia   . Neuropathic pain   . Thyroid disease   . Vitamin D deficiency     Past Surgical History:  Procedure Laterality Date  . CERVICAL DISC SURGERY    . CHOLECYSTECTOMY      Family Psychiatric History: None  Family History:  Family History  Problem Relation Age of Onset  . Bipolar disorder Neg Hx   . Dementia Neg Hx   . OCD Neg Hx   . ADD / ADHD Neg Hx   . Alcohol abuse Neg Hx   . Drug abuse Neg Hx   . Anxiety disorder Neg Hx   . Depression Neg Hx   . Paranoid behavior Neg Hx   . Schizophrenia Neg Hx   . Seizures Neg Hx   . Sexual abuse Neg Hx   . Physical abuse Neg Hx     Social History:  Social History   Socioeconomic History  . Marital status: Married    Spouse name: None  . Number of children: None  . Years of education: None  . Highest education level: None  Social Needs  . Financial resource strain: None  . Food insecurity - worry: None  . Food insecurity - inability: None  . Transportation needs - medical: None  . Transportation needs -  non-medical: None  Occupational History  . None  Tobacco Use  . Smoking status: Never Smoker  . Smokeless tobacco: Never Used  Substance and Sexual Activity  . Alcohol use: No  . Drug use: No  . Sexual activity: Yes  Other Topics Concern  . None  Social History Narrative  . None    Allergies:  Allergies  Allergen Reactions  . Tetanus Toxoids Anaphylaxis  . Codeine Nausea And Vomiting    Metabolic Disorder Labs: No results found for: HGBA1C, MPG No results found for: PROLACTIN No results found for: CHOL, TRIG, HDL, CHOLHDL, VLDL, LDLCALC No results found for: TSH  Therapeutic Level Labs: No results found for: LITHIUM No results found for: VALPROATE No components found for:  CBMZ  Current Medications: Current Outpatient Medications  Medication Sig Dispense Refill  . Cranberry 250 MG CAPS Take by mouth.    . divalproex  (DEPAKOTE) 250 MG DR tablet Take 1 tablet (250 mg total) by mouth 2 (two) times daily. 180 tablet 3  . doxepin (SINEQUAN) 50 MG capsule Take 5 capsule at bed time 450 capsule 5  . doxycycline (VIBRA-TABS) 100 MG tablet     . fenofibrate 160 MG tablet Take 160 mg by mouth daily.     Marland Kitchen glipiZIDE (GLUCOTROL XL) 10 MG 24 hr tablet Take 10 mg by mouth daily with breakfast.     . HUMALOG 100 UNIT/ML injection Sliding scale    . Insulin Glargine (TOUJEO SOLOSTAR ) Inject 30 Units into the skin daily.    Marland Kitchen lithium 300 MG tablet Take 1/2 twice a day 90 tablet 5  . MONOJECT ULTRA COMFORT SYRINGE 29G X 1/2" 0.5 ML MISC     . oxyCODONE-acetaminophen (PERCOCET/ROXICET) 5-325 MG per tablet Take 1 tablet by mouth as needed.     Marland Kitchen SYNTHROID 112 MCG tablet Take 112 mcg by mouth daily before breakfast.     . Vitamin D, Ergocalciferol, (DRISDOL) 50000 UNITS CAPS Take 50,000 Units by mouth every 7 (seven) days.     Marland Kitchen VYTORIN 10-40 MG per tablet Take 1 tablet by mouth daily.      No current facility-administered medications for this visit.      Musculoskeletal: Strength & Muscle Tone: decreased Gait & Station: unable to stand Patient leans: N/A  Psychiatric Specialty Exam: Review of Systems  Musculoskeletal: Positive for back pain and joint pain.  Psychiatric/Behavioral: The patient is nervous/anxious.        Confusion  All other systems reviewed and are negative.   Blood pressure 102/62, pulse 98, height 5' (1.524 m), SpO2 98 %.Body mass index is 41.01 kg/m.  General Appearance: Disheveled  Eye Contact:  Poor  Speech:  Garbled  Volume:  Normal  Mood:  Anxious  Affect:  Labile  Thought Process:  Disorganized  Orientation:  Full (Time, Place, and Person)  Thought Content: Illogical, Obsessions and Rumination   Suicidal Thoughts:  No  Homicidal Thoughts:  No  Memory:  Immediate;   Poor Recent;   Poor Remote;   Poor  Judgement:  Impaired  Insight:  Lacking  Psychomotor Activity:  Wheelchair  unable to stand  Concentration:  Concentration: Poor and Attention Span: Poor  Recall:  Poor  Fund of Knowledge: Fair  Language: Fair  Akathisia:  No  Handed:  Right  AIMS (if indicated): not done  Assets:  Communication Skills Desire for Improvement Resilience Social Support  ADL's:  Impaired  Cognition: Impaired,  Mild  Sleep:  Fair   Screenings:  Assessment and Plan: She is a 71 year old female with a long history of bipolar disorder.  She continues on low doses of Depakote and lithium.  We will check the levels of these today.  She also takes doxepin at night.  She seems more confused today and overwhelmed with worry regarding her husband.  Her son is aware of these issues and is looking for possible out-of-home placement for both parents.  For now she will continue lithium 300 mg daily and Depakote 250 mg twice a day as well as doxepin 50 mg at bedtime.  She will return to see me in 6 months   Diannia Rudereborah Ambra Haverstick, MD 12/31/2016, 1:54 PM

## 2017-01-01 LAB — VALPROIC ACID LEVEL: VALPROIC ACID LVL: 17 mg/L — AB (ref 50.0–100.0)

## 2017-01-01 LAB — LITHIUM LEVEL: LITHIUM LVL: 0.5 mmol/L — AB (ref 0.6–1.2)

## 2018-04-03 DEATH — deceased

## 2020-01-05 ENCOUNTER — Other Ambulatory Visit: Payer: Medicare (Managed Care) | Attending: Gastroenterology

## 2020-04-05 ENCOUNTER — Ambulatory Visit: Admission: RE | Admit: 2020-04-05 | Payer: Medicare (Managed Care) | Source: Home / Self Care

## 2020-04-05 ENCOUNTER — Encounter: Admission: RE | Payer: Self-pay | Source: Home / Self Care

## 2020-04-05 SURGERY — COLONOSCOPY WITH PROPOFOL
Anesthesia: General
